# Patient Record
Sex: Male | Born: 1950 | Race: White | Hispanic: No | Smoking: Former smoker
Health system: Southern US, Community
[De-identification: ages and names within clinical notes are randomized; demographics above are authoritative.]

## PROBLEM LIST (undated history)

## (undated) DIAGNOSIS — F17210 Nicotine dependence, cigarettes, uncomplicated: Secondary | ICD-10-CM

## (undated) DIAGNOSIS — K279 Peptic ulcer, site unspecified, unspecified as acute or chronic, without hemorrhage or perforation: Secondary | ICD-10-CM

## (undated) DIAGNOSIS — E559 Vitamin D deficiency, unspecified: Secondary | ICD-10-CM

## (undated) DIAGNOSIS — E538 Deficiency of other specified B group vitamins: Secondary | ICD-10-CM

## (undated) DIAGNOSIS — J189 Pneumonia, unspecified organism: Secondary | ICD-10-CM

## (undated) DIAGNOSIS — E039 Hypothyroidism, unspecified: Secondary | ICD-10-CM

## (undated) DIAGNOSIS — R200 Anesthesia of skin: Secondary | ICD-10-CM

## (undated) DIAGNOSIS — F329 Major depressive disorder, single episode, unspecified: Secondary | ICD-10-CM

## (undated) DIAGNOSIS — Z972 Presence of dental prosthetic device (complete) (partial): Secondary | ICD-10-CM

## (undated) DIAGNOSIS — N210 Calculus in bladder: Secondary | ICD-10-CM

## (undated) DIAGNOSIS — Z973 Presence of spectacles and contact lenses: Secondary | ICD-10-CM

## (undated) DIAGNOSIS — J449 Chronic obstructive pulmonary disease, unspecified: Secondary | ICD-10-CM

## (undated) DIAGNOSIS — M545 Low back pain, unspecified: Secondary | ICD-10-CM

## (undated) DIAGNOSIS — E119 Type 2 diabetes mellitus without complications: Secondary | ICD-10-CM

## (undated) DIAGNOSIS — N401 Enlarged prostate with lower urinary tract symptoms: Secondary | ICD-10-CM

## (undated) DIAGNOSIS — M1990 Unspecified osteoarthritis, unspecified site: Secondary | ICD-10-CM

## (undated) DIAGNOSIS — E782 Mixed hyperlipidemia: Secondary | ICD-10-CM

## (undated) DIAGNOSIS — N400 Enlarged prostate without lower urinary tract symptoms: Secondary | ICD-10-CM

## (undated) DIAGNOSIS — F32A Depression, unspecified: Secondary | ICD-10-CM

## (undated) DIAGNOSIS — F1010 Alcohol abuse, uncomplicated: Secondary | ICD-10-CM

## (undated) DIAGNOSIS — E785 Hyperlipidemia, unspecified: Secondary | ICD-10-CM

## (undated) DIAGNOSIS — N329 Bladder disorder, unspecified: Secondary | ICD-10-CM

## (undated) HISTORY — DX: Benign prostatic hyperplasia without lower urinary tract symptoms: N40.0

## (undated) HISTORY — DX: Anesthesia of skin: R20.0

## (undated) HISTORY — PX: BACK SURGERY: SHX140

## (undated) HISTORY — DX: Depression, unspecified: F32.A

## (undated) HISTORY — DX: Deficiency of other specified B group vitamins: E53.8

## (undated) HISTORY — DX: Type 2 diabetes mellitus without complications: E11.9

## (undated) HISTORY — PX: UPPER GI ENDOSCOPY: SHX6162

## (undated) HISTORY — DX: Low back pain, unspecified: M54.50

## (undated) HISTORY — DX: Peptic ulcer, site unspecified, unspecified as acute or chronic, without hemorrhage or perforation: K27.9

## (undated) HISTORY — PX: TONSILLECTOMY: SUR1361

## (undated) HISTORY — PX: KNEE SURGERY: SHX244

## (undated) HISTORY — DX: Hyperlipidemia, unspecified: E78.5

## (undated) HISTORY — DX: Hypothyroidism, unspecified: E03.9

## (undated) HISTORY — PX: COLONOSCOPY: SHX174

---
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
## 1998-01-01 ENCOUNTER — Emergency Department (HOSPITAL_COMMUNITY): Admission: EM | Admit: 1998-01-01 | Discharge: 1998-01-01 | Payer: Self-pay | Admitting: Emergency Medicine

## 1998-02-13 ENCOUNTER — Encounter: Admission: RE | Admit: 1998-02-13 | Discharge: 1998-05-14 | Payer: Self-pay | Admitting: Internal Medicine

## 2005-04-16 ENCOUNTER — Ambulatory Visit: Payer: Self-pay | Admitting: Endocrinology

## 2005-04-16 ENCOUNTER — Inpatient Hospital Stay (HOSPITAL_COMMUNITY): Admission: EM | Admit: 2005-04-16 | Discharge: 2005-04-17 | Payer: Self-pay | Admitting: Emergency Medicine

## 2005-04-17 ENCOUNTER — Ambulatory Visit: Payer: Self-pay | Admitting: Cardiology

## 2005-04-21 ENCOUNTER — Ambulatory Visit: Payer: Self-pay

## 2005-04-27 ENCOUNTER — Ambulatory Visit: Payer: Self-pay | Admitting: Cardiology

## 2005-04-28 ENCOUNTER — Ambulatory Visit: Payer: Self-pay | Admitting: Cardiology

## 2005-05-14 ENCOUNTER — Ambulatory Visit: Payer: Self-pay | Admitting: Internal Medicine

## 2005-07-14 ENCOUNTER — Ambulatory Visit: Payer: Self-pay | Admitting: Internal Medicine

## 2005-07-22 ENCOUNTER — Ambulatory Visit: Payer: Self-pay | Admitting: Internal Medicine

## 2006-10-06 ENCOUNTER — Ambulatory Visit: Payer: Self-pay | Admitting: Internal Medicine

## 2006-11-17 ENCOUNTER — Ambulatory Visit: Payer: Self-pay | Admitting: Internal Medicine

## 2006-11-17 LAB — CONVERTED CEMR LAB: TSH: 6.27 microintl units/mL — ABNORMAL HIGH (ref 0.35–5.50)

## 2006-11-21 ENCOUNTER — Ambulatory Visit: Payer: Self-pay | Admitting: Internal Medicine

## 2006-12-05 ENCOUNTER — Ambulatory Visit: Payer: Self-pay | Admitting: Internal Medicine

## 2007-05-19 ENCOUNTER — Ambulatory Visit: Payer: Self-pay | Admitting: Internal Medicine

## 2007-05-19 DIAGNOSIS — M79609 Pain in unspecified limb: Secondary | ICD-10-CM

## 2007-05-19 DIAGNOSIS — E785 Hyperlipidemia, unspecified: Secondary | ICD-10-CM

## 2007-05-19 DIAGNOSIS — E039 Hypothyroidism, unspecified: Secondary | ICD-10-CM | POA: Insufficient documentation

## 2007-05-23 LAB — CONVERTED CEMR LAB
ALT: 27 units/L (ref 0–53)
AST: 23 units/L (ref 0–37)
Albumin: 4.2 g/dL (ref 3.5–5.2)
Alkaline Phosphatase: 56 units/L (ref 39–117)
BUN: 11 mg/dL (ref 6–23)
Basophils Absolute: 0 10*3/uL (ref 0.0–0.1)
Basophils Relative: 0.3 % (ref 0.0–1.0)
Bilirubin, Direct: 0.1 mg/dL (ref 0.0–0.3)
CO2: 33 meq/L — ABNORMAL HIGH (ref 19–32)
Calcium: 9.9 mg/dL (ref 8.4–10.5)
Chloride: 106 meq/L (ref 96–112)
Cholesterol: 155 mg/dL (ref 0–200)
Creatinine, Ser: 1.2 mg/dL (ref 0.4–1.5)
Direct LDL: 95.5 mg/dL
Eosinophils Absolute: 0.2 10*3/uL (ref 0.0–0.6)
Eosinophils Relative: 1.8 % (ref 0.0–5.0)
GFR calc Af Amer: 80 mL/min
GFR calc non Af Amer: 66 mL/min
Glucose, Bld: 107 mg/dL — ABNORMAL HIGH (ref 70–99)
HCT: 46.3 % (ref 39.0–52.0)
HDL: 22.9 mg/dL — ABNORMAL LOW (ref >39.0)
Hemoglobin: 16.1 g/dL (ref 13.0–17.0)
Lymphocytes Relative: 37.5 % (ref 12.0–46.0)
MCHC: 34.7 g/dL (ref 30.0–36.0)
MCV: 101.5 fL — ABNORMAL HIGH (ref 78.0–100.0)
Monocytes Absolute: 0.6 10*3/uL (ref 0.2–0.7)
Monocytes Relative: 5.1 % (ref 3.0–11.0)
Neutro Abs: 5.9 10*3/uL (ref 1.4–7.7)
Neutrophils Relative %: 55.3 % (ref 43.0–77.0)
Platelets: 283 10*3/uL (ref 150–400)
Potassium: 4.3 meq/L (ref 3.5–5.1)
RBC: 4.57 M/uL (ref 4.22–5.81)
RDW: 12.5 % (ref 11.5–14.6)
Sodium: 145 meq/L (ref 135–145)
TSH: 3.49 microintl units/mL (ref 0.35–5.50)
Total Bilirubin: 0.9 mg/dL (ref 0.3–1.2)
Total CHOL/HDL Ratio: 6.8
Total Protein: 7.1 g/dL (ref 6.0–8.3)
Triglycerides: 259 mg/dL (ref 0–149)
VLDL: 52 mg/dL — ABNORMAL HIGH (ref 0–40)
WBC: 10.8 10*3/uL — ABNORMAL HIGH (ref 4.5–10.5)

## 2007-07-17 ENCOUNTER — Ambulatory Visit: Payer: Self-pay | Admitting: Internal Medicine

## 2007-07-20 ENCOUNTER — Encounter: Admission: RE | Admit: 2007-07-20 | Discharge: 2007-07-20 | Payer: Self-pay | Admitting: Internal Medicine

## 2007-07-31 LAB — CONVERTED CEMR LAB
BUN: 13 mg/dL (ref 6–23)
CO2: 26 meq/L (ref 19–32)
Calcium: 9.7 mg/dL (ref 8.4–10.5)
Chloride: 102 meq/L (ref 96–112)
Creatinine, Ser: 1.1 mg/dL (ref 0.4–1.5)
GFR calc Af Amer: 88 mL/min
GFR calc non Af Amer: 73 mL/min
Glucose, Bld: 170 mg/dL — ABNORMAL HIGH (ref 70–99)
Potassium: 4 meq/L (ref 3.5–5.1)
Sodium: 139 meq/L (ref 135–145)
TSH: 6.66 microintl units/mL — ABNORMAL HIGH (ref 0.35–5.50)

## 2007-09-21 HISTORY — PX: BACK SURGERY: SHX140

## 2007-10-19 ENCOUNTER — Ambulatory Visit (HOSPITAL_COMMUNITY): Admission: RE | Admit: 2007-10-19 | Discharge: 2007-10-20 | Payer: Self-pay | Admitting: Neurosurgery

## 2007-10-19 HISTORY — PX: LUMBAR LAMINECTOMY: SHX95

## 2007-11-09 ENCOUNTER — Encounter: Payer: Self-pay | Admitting: Internal Medicine

## 2007-11-17 ENCOUNTER — Ambulatory Visit: Payer: Self-pay | Admitting: Internal Medicine

## 2007-11-17 DIAGNOSIS — G2581 Restless legs syndrome: Secondary | ICD-10-CM

## 2007-11-21 LAB — CONVERTED CEMR LAB: Ferritin: 210.2 ng/mL (ref 22.0–322.0)

## 2007-11-29 ENCOUNTER — Encounter: Payer: Self-pay | Admitting: Internal Medicine

## 2008-09-24 ENCOUNTER — Ambulatory Visit: Payer: Self-pay | Admitting: Internal Medicine

## 2008-09-24 DIAGNOSIS — M722 Plantar fascial fibromatosis: Secondary | ICD-10-CM | POA: Insufficient documentation

## 2009-03-31 ENCOUNTER — Encounter (INDEPENDENT_AMBULATORY_CARE_PROVIDER_SITE_OTHER): Payer: Self-pay | Admitting: *Deleted

## 2009-11-20 ENCOUNTER — Telehealth: Payer: Self-pay | Admitting: Gastroenterology

## 2010-10-20 NOTE — Progress Notes (Signed)
Summary: Schedule Colonoscopy  Phone Note Outgoing Call Call back at Home Phone [phone]   Call placed by: Harlow Mares CMA Duncan Dull),  November 20, 2009 10:27 AM Call placed to: Patient Summary of Call: patinets number is disconnected Initial call taken by: Harlow Mares CMA (AAMA),  November 20, 2009 10:28 AM

## 2011-02-02 NOTE — Op Note (Signed)
NAME:  MASARU, CHAMBERLIN NO.:  [id number]   MEDICAL RECORD NO.:  [id number]          PATIENT TYPE:  OIB   LOCATION:  3599                         FACILITY:  [facility]   PHYSICIAN:  Reinaldo Meeker, M.D. DATE OF BIRTH:  06-21-1951   DATE OF PROCEDURE:  10/19/2007  DATE OF DISCHARGE:                               OPERATIVE REPORT   PREOPERATIVE DIAGNOSIS:  Spinal stenosis L3-L4 and L4-L5.   POSTOPERATIVE DIAGNOSIS:  Spinal stenosis L3-L4 and L4-L5.   PROCEDURE:  L3-L4 and L4-L5 bilateral complete decompressive  laminectomy.   SURGEON:  Reinaldo Meeker, M.D.   PROCEDURE IN DETAIL:  After being placed in the prone position, the  patient's back was prepped and draped in the usual sterile fashion.  A  localizing x-ray was taken prior to incision to identify the appropriate  level.  A midline incision was made above the spinous processes of L3,  L4, and L5.  Using Bovie cutting current, the incision was carried up  the spinous processes.  Subperiosteal dissection was then carried out  bilaterally on the spinous processes and lamina and facet joint and self-  retaining retractor was placed for exposure.  X-rays showed we  approached the appropriate level.  Spinous processes of L3, L4, and L5  were removed.  Starting on the patient's left side, a laminotomy was  performed by removing the inferior half of the L3 lamina, the third of  the facet joint, the entire L4 lamina, the medial third of the L4-L5  facet joint, and the superior half of the L5 lamina.  Residual bone and  hypertrophic ligamentum flavum were removed in a piecemeal fashion.  A  similar decompression was then carried out on the opposite side.  Once  this was completed, residual midline structures were removed to complete  the bilateral decompressive laminectomy.  At this time, L4 and L5 nerve  roots were tracked up their foramen and found to be well decompressed.  There is no evidence of further lateral  recess stenosis, as well.  The  discs were examined and found not to be in need of removal.  At this  time, large amounts of irrigation were carried out.  Any bleeding was  controlled with bipolar coagulation and Gelfoam.  The wound was then  closed in multiple layers of Vicryl in the muscle, fascia, subcutaneous,  and subcuticular tissues, and staples were placed on the skin.  A  sterile dressing was then applied.  The patient was extubated and taken  to the recovery room in stable condition.           ______________________________  Reinaldo Meeker, M.D.     ROK/MEDQ  D:  10/19/2007  T:  10/19/2007  Job:  [id number]
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
## 2011-02-05 NOTE — Consult Note (Signed)
NAME:  MANU, RUBEY NO.:  [id number]   MEDICAL RECORD NO.:  [id number]          PATIENT TYPE:  INP   LOCATION:  2903                         FACILITY:  [facility]   PHYSICIAN:  Olga Millers, M.D. [facility] OF BIRTH:  Feb 23, 1951   DATE OF CONSULTATION:  04/17/2005  DATE OF DISCHARGE:                                   CONSULTATION   REASON FOR CONSULTATION:  Mr. Flury is a 60 year old male with past medical  history of tobacco use who presents for evaluation of chest pain.  The  patient has no prior cardiac history.  He states he has occasionally had  heartburn in the past.  However, he does not have exertional chest pain.  He  does have chronic dyspnea on exertion, but there is no orthopnea or PND.  He  states that he occasionally has mild pedal edema.  Yesterday evening while  walking to the kitchen the patient developed left upper chest discomfort.  It was described as a sharp pain.  It radiated to the left neck, but not  to the left upper extremity.  There was associated shortness of breath and  diaphoresis, but there was no nausea or vomiting.  It did increase with  certain movements by his report.  It lasted for five to 10 minutes and  resolved spontaneously.  He came to the emergency room and was admitted.  Cardiology is now asked to further evaluate.   ALLERGIES:  No known drug allergies.   MEDICATIONS:  He is on no medications at home.   SOCIAL HISTORY:  He does smoke.  He also consumes five to 10 alcoholic  beverages per week by his report.  He denies any cocaine use.   FAMILY HISTORY:  Positive for coronary artery disease in his mother who had  a myocardial infarction at age 34, by his report, and also has had coronary  artery bypass grafting.   PAST MEDICAL HISTORY:  He denies any diabetes mellitus, hypertension, or  hyperlipidemia.  There is no prior cardiac history.  He has had prior  arthroscopic surgery on his knee, and tonsillectomy, but there is  no other  significant past medical history noted.   REVIEW OF SYSTEMS:  He denies any headaches, or fevers or chills.  There was  no productive cough or hemoptysis.  There is no dysphagia, odynophagia,  melena, or hematochezia.  There is no dysuria or hematuria.  There is no  seizure activity.  He does occasionally describe mild orthopnea and pedal  edema, but there is no PND.  He has chronic pain in his legs, but no  claudication.  The remaining systems are negative.   PHYSICAL EXAMINATION:  VITAL SIGNS:  Blood pressure 100/50, pulse 96.  He is  afebrile.  GENERAL:  He is well-developed, well-nourished, in no acute distress.  SKIN:  Warm and dry.  No peripheral clubbing.  PSYCHIATRIC:  He does not appear to be depressed.  HEENT:  Unremarkable with normal eyelids.  NECK:  Supple with normal upstrokes bilaterally.  I cannot appreciate any  bruits.  There is no  jugular venous distention.  I cannot appreciate  thyromegaly.  CHEST:  Clear to auscultation, normal expansion.  CARDIOVASCULAR:  Regular rate and rhythm with a normal S1 and S2.  There are  no murmurs, rubs, or gallops noted.  ABDOMEN:  Nontender, nondistended, positive bowel sounds, no  hepatosplenomegaly, no masses appreciated.  There is no abdominal bruit.  He  has 2+ femoral pulses bilaterally and there are no bruits noted.  EXTREMITIES:  No edema and I can palpate no cords.  He has 2+ dorsalis pedis  pulses bilaterally.  NEUROLOGIC:  Grossly intact.   LABORATORY DATA:  His electrocardiogram shows a possible ectopic atrial  rhythm at a rate of 74.  The axis is normal.  There are nonspecific ST  changes.  His chest x-ray showed low lung volumes and mild cardiomegaly by  report.  His hemoglobin is 15.2, hematocrit 45.  His platelet count is 302,  and his white blood cell count is normal.  His D-dimer is negative.  His BUN  and creatinine are 9 and 1.1.  His liver function's are normal.  His enzymes  are negative so far.   His alcohol level was 148.   DIAGNOSES:  1.  Atypical chest pain.  2.  Tobacco abuse.  3.  Possible ETOH abuse.   PLAN:  Mr. Coburn presents with atypical chest pain of uncertain etiology.  Given the description that it does increase with certain movements, it may  be musculoskeletal.  We will continue and complete rule out with serial  enzymes.  If his enzymes are negative and his followup electrocardiogram is  unchanged, then we will plan an outpatient Myoview for risk stratification.  If his enzymes are positive, then he would require cardiac catheterization.  I would continue with his present dose of aspirin.  I have discussed the  importance of discontinuing his tobacco use.  He also needs to reduce his  alcohol intake.       BC/MEDQ  D:  04/17/2005  T:  04/17/2005  Job:  [id number]

## 2011-06-10 LAB — COMPREHENSIVE METABOLIC PANEL
Alkaline Phosphatase: 53
BUN: 13
Chloride: 104
Creatinine, Ser: 1.21
Potassium: 4.9
Sodium: 141

## 2011-06-10 LAB — CBC
HCT: 47
Hemoglobin: 16.4
MCHC: 34.9
MCV: 98.2
Platelets: 270
RBC: 4.79
RDW: 12.8
WBC: 11.1 — ABNORMAL HIGH

## 2013-09-20 DIAGNOSIS — Z8619 Personal history of other infectious and parasitic diseases: Secondary | ICD-10-CM

## 2013-09-20 HISTORY — DX: Personal history of other infectious and parasitic diseases: Z86.19

## 2013-09-20 HISTORY — PX: UPPER GI ENDOSCOPY: SHX6162

## 2014-07-08 DIAGNOSIS — D7589 Other specified diseases of blood and blood-forming organs: Secondary | ICD-10-CM | POA: Diagnosis not present

## 2014-07-08 DIAGNOSIS — E039 Hypothyroidism, unspecified: Secondary | ICD-10-CM | POA: Diagnosis not present

## 2014-07-08 DIAGNOSIS — G5620 Lesion of ulnar nerve, unspecified upper limb: Secondary | ICD-10-CM | POA: Diagnosis not present

## 2014-07-08 DIAGNOSIS — Z125 Encounter for screening for malignant neoplasm of prostate: Secondary | ICD-10-CM | POA: Diagnosis not present

## 2014-07-08 DIAGNOSIS — Z79899 Other long term (current) drug therapy: Secondary | ICD-10-CM | POA: Diagnosis not present

## 2014-07-08 DIAGNOSIS — Z0000 Encounter for general adult medical examination without abnormal findings: Secondary | ICD-10-CM | POA: Diagnosis not present

## 2014-07-08 DIAGNOSIS — Z23 Encounter for immunization: Secondary | ICD-10-CM | POA: Diagnosis not present

## 2014-07-08 DIAGNOSIS — E1122 Type 2 diabetes mellitus with diabetic chronic kidney disease: Secondary | ICD-10-CM | POA: Diagnosis not present

## 2014-07-08 DIAGNOSIS — N183 Chronic kidney disease, stage 3 (moderate): Secondary | ICD-10-CM | POA: Diagnosis not present

## 2014-07-08 DIAGNOSIS — I10 Essential (primary) hypertension: Secondary | ICD-10-CM | POA: Diagnosis not present

## 2014-07-09 ENCOUNTER — Other Ambulatory Visit: Payer: Self-pay | Admitting: Family Medicine

## 2014-07-09 DIAGNOSIS — I714 Abdominal aortic aneurysm, without rupture, unspecified: Secondary | ICD-10-CM

## 2014-07-15 ENCOUNTER — Ambulatory Visit
Admission: RE | Admit: 2014-07-15 | Discharge: 2014-07-15 | Disposition: A | Discharge status: Home / Self Care | Payer: Medicare Other | Source: Ambulatory Visit | Attending: Family Medicine | Admitting: Family Medicine

## 2014-07-15 DIAGNOSIS — I714 Abdominal aortic aneurysm, without rupture, unspecified: Secondary | ICD-10-CM

## 2014-07-15 DIAGNOSIS — I779 Disorder of arteries and arterioles, unspecified: Secondary | ICD-10-CM | POA: Diagnosis not present

## 2014-09-06 DIAGNOSIS — N183 Chronic kidney disease, stage 3 (moderate): Secondary | ICD-10-CM | POA: Diagnosis not present

## 2014-09-06 DIAGNOSIS — E1122 Type 2 diabetes mellitus with diabetic chronic kidney disease: Secondary | ICD-10-CM | POA: Diagnosis not present

## 2014-10-21 DEATH — deceased

## 2014-11-18 ENCOUNTER — Emergency Department (HOSPITAL_COMMUNITY): Payer: Medicare Other

## 2014-11-18 ENCOUNTER — Encounter (HOSPITAL_COMMUNITY): Payer: Self-pay | Admitting: Emergency Medicine

## 2014-11-18 ENCOUNTER — Emergency Department (HOSPITAL_COMMUNITY)
Admission: EM | Admit: 2014-11-18 | Discharge: 2014-11-18 | Disposition: A | Discharge status: Home / Self Care | Payer: Medicare Other | Attending: Emergency Medicine | Admitting: Emergency Medicine

## 2014-11-18 DIAGNOSIS — R079 Chest pain, unspecified: Secondary | ICD-10-CM | POA: Diagnosis present

## 2014-11-18 DIAGNOSIS — K760 Fatty (change of) liver, not elsewhere classified: Secondary | ICD-10-CM | POA: Diagnosis not present

## 2014-11-18 DIAGNOSIS — R200 Anesthesia of skin: Secondary | ICD-10-CM | POA: Diagnosis not present

## 2014-11-18 DIAGNOSIS — Z79899 Other long term (current) drug therapy: Secondary | ICD-10-CM | POA: Insufficient documentation

## 2014-11-18 DIAGNOSIS — K8050 Calculus of bile duct without cholangitis or cholecystitis without obstruction: Secondary | ICD-10-CM | POA: Insufficient documentation

## 2014-11-18 DIAGNOSIS — I252 Old myocardial infarction: Secondary | ICD-10-CM | POA: Insufficient documentation

## 2014-11-18 DIAGNOSIS — Z720 Tobacco use: Secondary | ICD-10-CM | POA: Insufficient documentation

## 2014-11-18 DIAGNOSIS — Z7982 Long term (current) use of aspirin: Secondary | ICD-10-CM | POA: Insufficient documentation

## 2014-11-18 DIAGNOSIS — E119 Type 2 diabetes mellitus without complications: Secondary | ICD-10-CM | POA: Insufficient documentation

## 2014-11-18 DIAGNOSIS — R109 Unspecified abdominal pain: Secondary | ICD-10-CM

## 2014-11-18 DIAGNOSIS — K8020 Calculus of gallbladder without cholecystitis without obstruction: Secondary | ICD-10-CM | POA: Diagnosis not present

## 2014-11-18 DIAGNOSIS — K839 Disease of biliary tract, unspecified: Secondary | ICD-10-CM | POA: Diagnosis not present

## 2014-11-18 DIAGNOSIS — R1013 Epigastric pain: Secondary | ICD-10-CM | POA: Diagnosis not present

## 2014-11-18 DIAGNOSIS — R1011 Right upper quadrant pain: Secondary | ICD-10-CM | POA: Diagnosis not present

## 2014-11-18 LAB — I-STAT CHEM 8, ED
BUN: 12 mg/dL (ref 6–23)
CALCIUM ION: 1.16 mmol/L (ref 1.13–1.30)
Chloride: 103 mmol/L (ref 96–112)
Creatinine, Ser: 1.2 mg/dL (ref 0.50–1.35)
Glucose, Bld: 155 mg/dL — ABNORMAL HIGH (ref 70–99)
HCT: 54 % — ABNORMAL HIGH (ref 39.0–52.0)
HEMOGLOBIN: 18.4 g/dL — AB (ref 13.0–17.0)
Potassium: 3.8 mmol/L (ref 3.5–5.1)
SODIUM: 140 mmol/L (ref 135–145)
TCO2: 23 mmol/L (ref 0–100)

## 2014-11-18 LAB — DIFFERENTIAL
BASOS ABS: 0.1 10*3/uL (ref 0.0–0.1)
BASOS PCT: 1 % (ref 0–1)
EOS ABS: 0.3 10*3/uL (ref 0.0–0.7)
EOS PCT: 3 % (ref 0–5)
LYMPHS ABS: 4.4 10*3/uL — AB (ref 0.7–4.0)
LYMPHS PCT: 45 % (ref 12–46)
MONO ABS: 0.5 10*3/uL (ref 0.1–1.0)
MONOS PCT: 5 % (ref 3–12)
NEUTROS ABS: 4.5 10*3/uL (ref 1.7–7.7)
Neutrophils Relative %: 46 % (ref 43–77)

## 2014-11-18 LAB — CBC
HEMATOCRIT: 48.5 % (ref 39.0–52.0)
HEMOGLOBIN: 17 g/dL (ref 13.0–17.0)
MCH: 34.9 pg — AB (ref 26.0–34.0)
MCHC: 35.1 g/dL (ref 30.0–36.0)
MCV: 99.6 fL (ref 78.0–100.0)
Platelets: 255 10*3/uL (ref 150–400)
RBC: 4.87 MIL/uL (ref 4.22–5.81)
RDW: 13.2 % (ref 11.5–15.5)
WBC: 9.8 10*3/uL (ref 4.0–10.5)

## 2014-11-18 LAB — LIPASE, BLOOD: Lipase: 43 U/L (ref 11–59)

## 2014-11-18 LAB — HEPATIC FUNCTION PANEL
ALBUMIN: 4.1 g/dL (ref 3.5–5.2)
ALT: 29 U/L (ref 0–53)
AST: 23 U/L (ref 0–37)
Alkaline Phosphatase: 50 U/L (ref 39–117)
BILIRUBIN INDIRECT: 0.5 mg/dL (ref 0.3–0.9)
Bilirubin, Direct: 0.1 mg/dL (ref 0.0–0.5)
TOTAL PROTEIN: 6.8 g/dL (ref 6.0–8.3)
Total Bilirubin: 0.6 mg/dL (ref 0.3–1.2)

## 2014-11-18 LAB — I-STAT TROPONIN, ED: TROPONIN I, POC: 0 ng/mL (ref 0.00–0.08)

## 2014-11-18 MED ORDER — ASPIRIN 81 MG PO CHEW
324.0000 mg | CHEWABLE_TABLET | Freq: Once | ORAL | Status: AC
  Administered 2014-11-18: 324 mg via ORAL
  Filled 2014-11-18: qty 4

## 2014-11-18 MED ORDER — ONDANSETRON HCL 4 MG/2ML IJ SOLN
4.0000 mg | Freq: Once | INTRAMUSCULAR | Status: AC
  Administered 2014-11-18: 4 mg via INTRAVENOUS
  Filled 2014-11-18: qty 2

## 2014-11-18 MED ORDER — NITROGLYCERIN 0.4 MG SL SUBL
0.4000 mg | SUBLINGUAL_TABLET | SUBLINGUAL | Status: DC | PRN
  Filled 2014-11-18: qty 1

## 2014-11-18 NOTE — ED Notes (Signed)
Pt st's he had onset of nausea and had dry heaves then developed left upper chest pain.  Pt c/o left arm feeling numb up into left shoulder

## 2014-11-18 NOTE — ED Notes (Signed)
Pt states nitro gives him terrible HA, declined at this time, pain 4/10

## 2014-11-18 NOTE — ED Provider Notes (Signed)
CSN: [id number]     Arrival date & time 11/18/14  0109 History  This chart was scribed for Leonard Fuel, MD by Randa Evens, ED Scribe. This patient was seen in room A09C/A09C and the patient's care was started at 1:52 AM.      Chief Complaint  Patient presents with  . Chest Pain   Patient is a 64 y.o. male presenting with chest pain. The history is provided by the patient. No language interpreter was used.  Chest Pain Associated symptoms: nausea, numbness and vomiting   Associated symptoms: no fever    HPI Comments: Leonard Walton is a 64 y.o. male with PMHx of diabetes who presents to the Emergency Department complaining of aching chest pain rated 7/10 onset PTA. Pt states that his CP began after feeling nauseous and vomiting. Pt states that he feels as if the left arm is numb. Pt denies any medications PTA. Pt states that the pain is worse with deep breathing. Pt states that sitting up makes the pain better. Pt doesn't report any other symptoms.   Past Medical History  Diagnosis Date  . MI (myocardial infarction)    Past Surgical History  Procedure Laterality Date  . Back surgery     No family history on file. History  Substance Use Topics  . Smoking status: Current Every Day Smoker  . Smokeless tobacco: Not on file  . Alcohol Use: Yes    Review of Systems  Constitutional: Negative for fever.  Cardiovascular: Positive for chest pain.  Gastrointestinal: Positive for nausea and vomiting.  Neurological: Positive for numbness.  All other systems reviewed and are negative.     Allergies  Review of patient's allergies indicates no known allergies.  Home Medications   Prior to Admission medications   Medication Sig Start Date End Date Taking? Authorizing Provider  aspirin EC 81 MG tablet Take 81 mg by mouth daily.   Yes Historical Provider, MD  levothyroxine (SYNTHROID, LEVOTHROID) 75 MCG tablet Take 75 mcg by mouth daily. 11/11/14  Yes Historical Provider, MD  metFORMIN  (GLUCOPHAGE) 500 MG tablet Take 500 mg by mouth 2 (two) times daily with a meal.  10/31/14  Yes Historical Provider, MD   BP 120/83 mmHg  Pulse 61  Temp(Src) 98.4 F (36.9 C) (Oral)  Resp 16  Ht 5\' 7"  (1.702 m)  Wt 210 lb (95.255 kg)  BMI 32.88 kg/m2  SpO2 100%   Physical Exam  Constitutional: He is oriented to person, place, and time. He appears well-developed and well-nourished. No distress.  HENT:  Head: Normocephalic and atraumatic.  Eyes: Conjunctivae and EOM are normal. Pupils are equal, round, and reactive to light.  Neck: Normal range of motion. Neck supple. No JVD present.  Cardiovascular: Normal rate, regular rhythm and normal heart sounds.   No murmur heard. Pulmonary/Chest: Effort normal and breath sounds normal. He has no wheezes. He has no rales. He exhibits no tenderness.  Abdominal: Soft. He exhibits no distension and no mass. Bowel sounds are decreased. There is tenderness (moderate) in the right upper quadrant and epigastric area. There is positive Murphy's sign.  Musculoskeletal: Normal range of motion. He exhibits no edema.  Lymphadenopathy:    He has no cervical adenopathy.  Neurological: He is alert and oriented to person, place, and time. No cranial nerve deficit. He exhibits normal muscle tone. Coordination normal.  Skin: Skin is warm and dry. No rash noted.  Psychiatric: He has a normal mood and affect. His behavior is normal.  Judgment and thought content normal.  Nursing note and vitals reviewed.   ED Course  Procedures (including critical care time) DIAGNOSTIC STUDIES: Oxygen Saturation is 100% on RA, normal by my interpretation.    COORDINATION OF CARE: 2:01 AM-Discussed treatment plan with pt at bedside and pt agreed to plan.     Labs Review Labs Reviewed  CBC - Abnormal; Notable for the following:    MCH 34.9 (*)    All other components within normal limits  DIFFERENTIAL - Abnormal; Notable for the following:    Lymphs Abs 4.4 (*)    All  other components within normal limits  I-STAT CHEM 8, ED - Abnormal; Notable for the following:    Glucose, Bld 155 (*)    Hemoglobin 18.4 (*)    HCT 54.0 (*)    All other components within normal limits  HEPATIC FUNCTION PANEL  LIPASE, BLOOD  I-STAT TROPOININ, ED    Imaging Review Dg Chest 2 View  11/18/2014   CLINICAL DATA:  Chest pain  EXAM: CHEST  2 VIEW  COMPARISON:  04/16/2005  FINDINGS: Normal heart size and mediastinal contours. No acute infiltrate or edema. No effusion or pneumothorax. No acute osseous findings.  IMPRESSION: No active cardiopulmonary disease.   Electronically Signed   By: Monte Fantasia M.D.   On: 11/18/2014 02:15   US Abdomen Complete  11/18/2014   CLINICAL DATA:  Diffuse abdominal pain with nausea and vomiting  EXAM: ULTRASOUND ABDOMEN COMPLETE  COMPARISON:  07/15/2014  FINDINGS: Gallbladder: Dependent mid level echoes consistent with sludge; no visible stone. No wall thickening or focal tenderness per sonographer exam  Common bile duct: Diameter: 5 mm.  Liver: Echogenic liver with poor acoustic transmission consistent with steatosis. This findings decreases the sensitivity of sonography in evaluating the liver. No focal lesion is seen. Noted antegrade flow in the imaged portal venous system  IVC: Not visualized  Pancreas: Not visualized  Spleen: Size and appearance within normal limits.  Right Kidney: Length: 11.7 Cm. Echogenicity within normal limits. No mass or hydronephrosis visualized.  Left Kidney: Length: 10 cm (likely underestimated). Echogenicity within normal limits. No mass or hydronephrosis visualized.  Abdominal aorta: No aneurysm visualized.  Other findings: None.  IMPRESSION: 1. Hepatic steatosis. 2. Gallbladder sludge. 3. Nonvisualized pancreas due to bowel gas.   Electronically Signed   By: Monte Fantasia M.D.   On: 11/18/2014 05:25     EKG Interpretation   Date/Time:  Monday November 18 2014 01:12:05 EST Ventricular Rate:  66 PR Interval:   140 QRS Duration: 86 QT Interval:  416 QTC Calculation: 436 R Axis:   111 Text Interpretation:  Normal sinus rhythm Right axis deviation Nonspecific  T wave abnormality Abnormal ECG When compared with ECG of 10/13/2007,  Nonspecific T wave abnormality is now Present Confirmed by [facility]  MD,  Jakerra Floyd ([id number]) on 11/18/2014 1:23:16 AM      MDM   Final diagnoses:  Abdominal pain, unspecified abdominal location  Biliary colic      Report of chest pain but findings of seemed to indicate problem is primarily in the abdomen. I am suspicious for possible biliary colic. He was given aspirin and was prescribed nitroglycerin but he refuses nitroglycerin. Pain has completely gone away but, on reexam, he continues to have mild tenderness in the epigastrium and right upper quadrant. He is sent for ultrasound to evaluate for possible cholelithiasis.  Ultrasound shows biliary sludge. Overall picture this visit is that of biliary colic. He is referred  to [facility] surgery for evaluation for possible elective cholecystectomy.   I personally performed the services described in this documentation, which was scribed in my presence. The recorded information has been reviewed and is accurate.       Leonard Fuel, MD 50/53/97 [id number]

## 2014-11-18 NOTE — Discharge Instructions (Signed)
Stay on a low fat diet.  Biliary Colic  Biliary colic is a steady or irregular pain in the upper abdomen. It is usually under the right side of the rib cage. It happens when gallstones interfere with the normal flow of bile from the gallbladder. Bile is a liquid that helps to digest fats. Bile is made in the liver and stored in the gallbladder. When you eat a meal, bile passes from the gallbladder through the cystic duct and the common bile duct into the small intestine. There, it mixes with partially digested food. If a gallstone blocks either of these ducts, the normal flow of bile is blocked. The muscle cells in the bile duct contract forcefully to try to move the stone. This causes the pain of biliary colic.  SYMPTOMS   A person with biliary colic usually complains of pain in the upper abdomen. This pain can be:  In the center of the upper abdomen just below the breastbone.  In the upper-right part of the abdomen, near the gallbladder and liver.  Spread back toward the right shoulder blade.  Nausea and vomiting.  The pain usually occurs after eating.  Biliary colic is usually triggered by the digestive system's demand for bile. The demand for bile is high after fatty meals. Symptoms can also occur when a person who has been fasting suddenly eats a very large meal. Most episodes of biliary colic pass after 1 to 5 hours. After the most intense pain passes, your abdomen may continue to ache mildly for about 24 hours. DIAGNOSIS  After you describe your symptoms, your caregiver will perform a physical exam. He or she will pay attention to the upper right portion of your belly (abdomen). This is the area of your liver and gallbladder. An ultrasound will help your caregiver look for gallstones. Specialized scans of the gallbladder may also be done. Blood tests may be done, especially if you have fever or if your pain persists. PREVENTION  Biliary colic can be prevented by controlling the risk  factors for gallstones. Some of these risk factors, such as heredity, increasing age, and pregnancy are a normal part of life. Obesity and a high-fat diet are risk factors you can change through a healthy lifestyle. Women going through menopause who take hormone replacement therapy (estrogen) are also more likely to develop biliary colic. TREATMENT   Pain medication may be prescribed.  You may be encouraged to eat a fat-free diet.  If the first episode of biliary colic is severe, or episodes of colic keep retuning, surgery to remove the gallbladder (cholecystectomy) is usually recommended. This procedure can be done through small incisions using an instrument called a laparoscope. The procedure often requires a brief stay in the hospital. Some people can leave the hospital the same day. It is the most widely used treatment in people troubled by painful gallstones. It is effective and safe, with no complications in more than 90% of cases.  If surgery cannot be done, medication that dissolves gallstones may be used. This medication is expensive and can take months or years to work. Only small stones will dissolve.  Rarely, medication to dissolve gallstones is combined with a procedure called shock-wave lithotripsy. This procedure uses carefully aimed shock waves to break up gallstones. In many people treated with this procedure, gallstones form again within a few years. PROGNOSIS  If gallstones block your cystic duct or common bile duct, you are at risk for repeated episodes of biliary colic. There is also  a 25% chance that you will develop a gallbladder infection(acute cholecystitis), or some other complication of gallstones within 10 to 20 years. If you have surgery, schedule it at a time that is convenient for you and at a time when you are not sick. HOME CARE INSTRUCTIONS   Drink plenty of clear fluids.  Avoid fatty, greasy or fried foods, or any foods that make your pain worse.  Take  medications as directed. SEEK MEDICAL CARE IF:   You develop a fever over 100.5 F (38.1 C).  Your pain gets worse over time.  You develop nausea that prevents you from eating and drinking.  You develop vomiting. SEEK IMMEDIATE MEDICAL CARE IF:   You have continuous or severe belly (abdominal) pain which is not relieved with medications.  You develop nausea and vomiting which is not relieved with medications.  You have symptoms of biliary colic and you suddenly develop a fever and shaking chills. This may signal cholecystitis. Call your caregiver immediately.  You develop a yellow color to your skin or the white part of your eyes (jaundice). Document Released: 02/07/2006 Document Revised: 11/29/2011 Document Reviewed: 04/18/2008 [facility] Patient Information 2015 [place], [place]. This information is not intended to replace advice given to you by your health care provider. Make sure you discuss any questions you have with your health care provider.

## 2015-01-05 IMAGING — US US AORTA
1 series · 14 of 25 positions shown · non-contrast
Comparison: None.

CLINICAL DATA: History of tobacco abuse

EXAM:
ULTRASOUND OF ABDOMINAL AORTA
TECHNIQUE: Ultrasound examination of the abdominal aorta was performed to
evaluate for abdominal aortic aneurysm.

[Series 1: us aorta · 0.32mm/px · 14 of 28 slices shown]
[im 1/28]
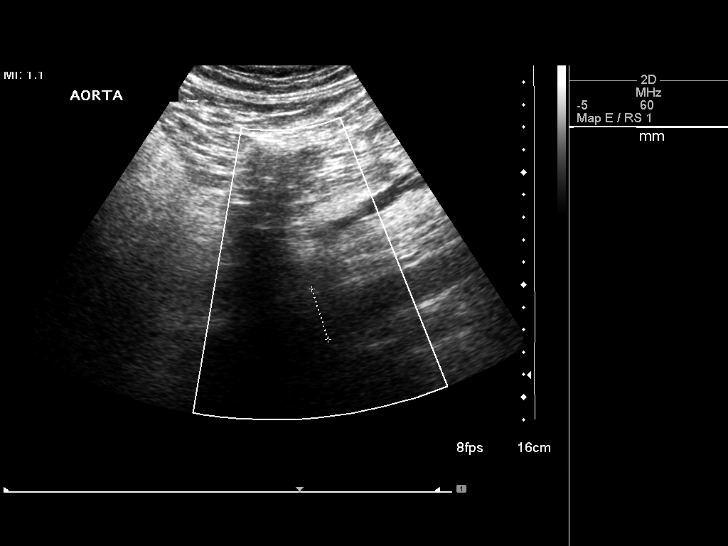
[im 3/28]
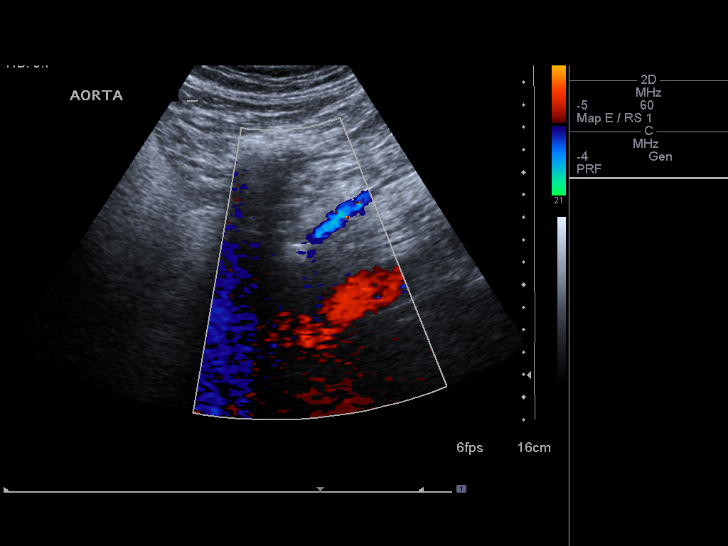
[im 5/28]
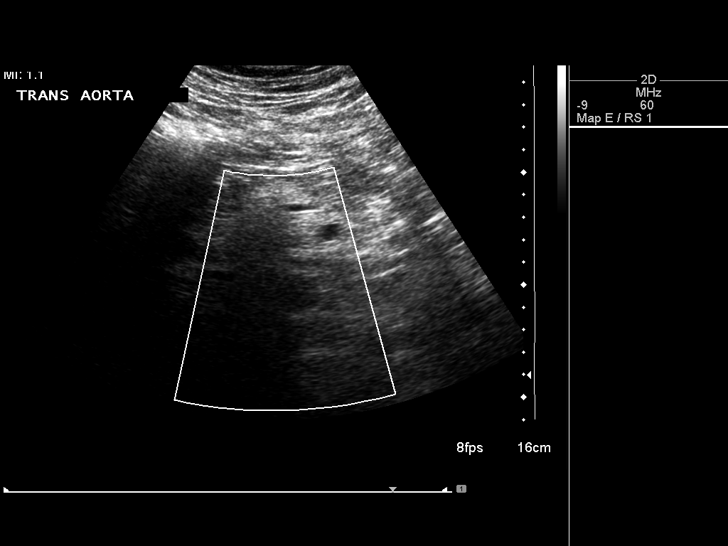
[im 7/28]
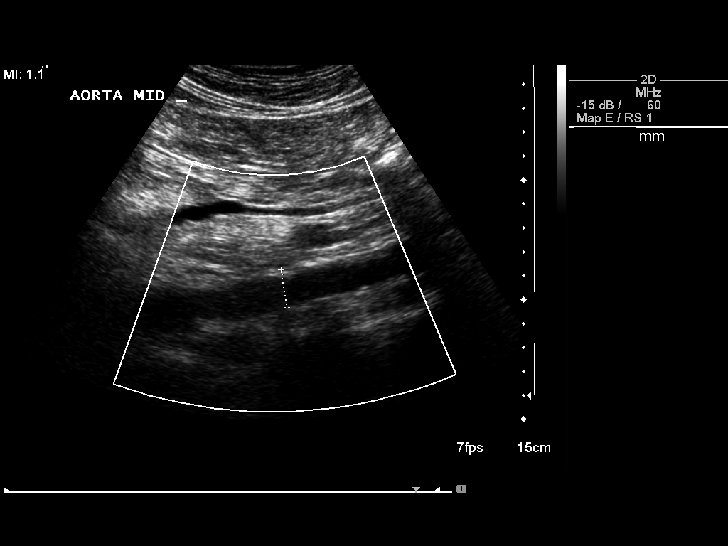
[im 10/28]
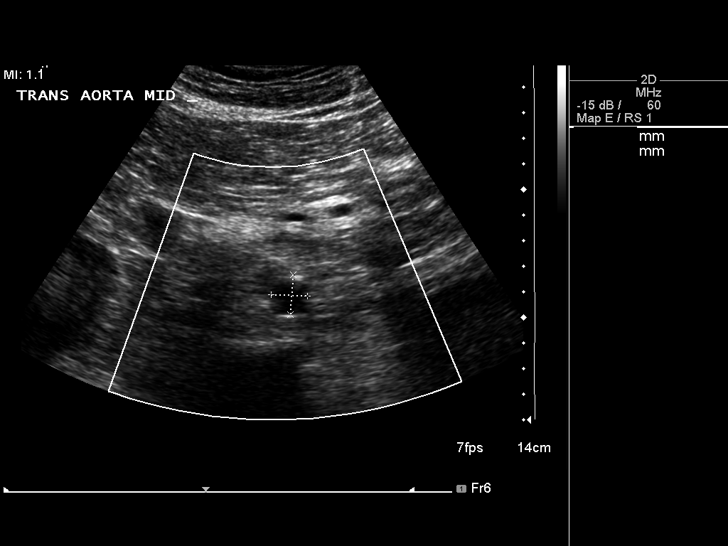
[im 11/28]
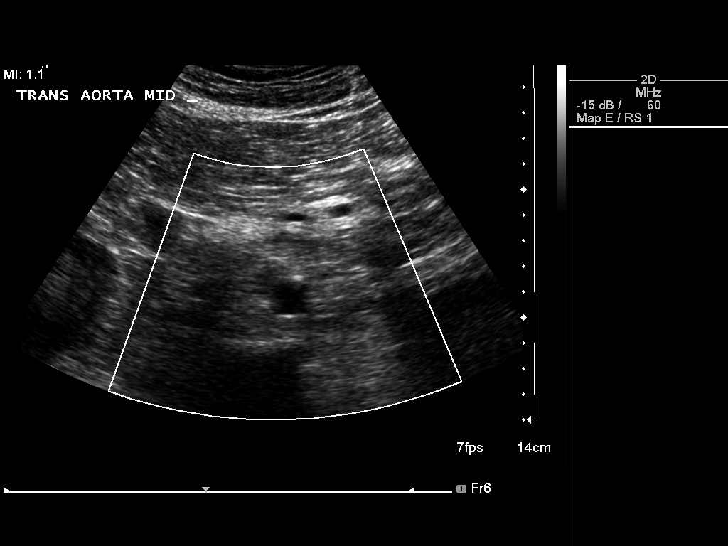
[im 13/28]
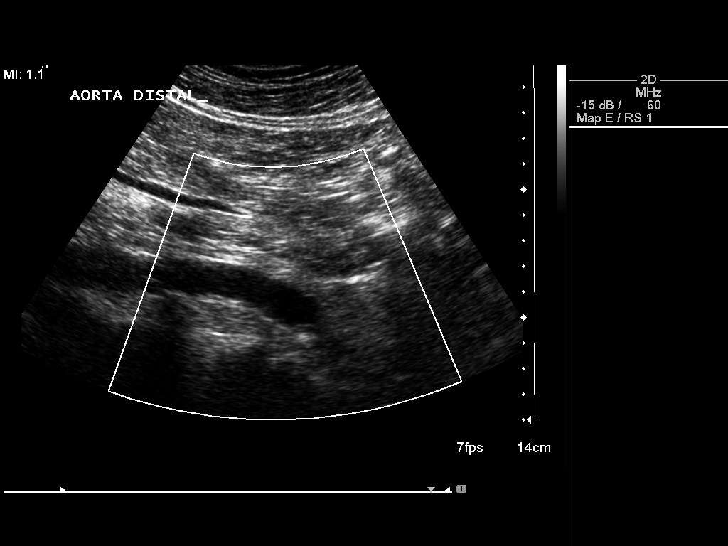
[im 15/28]
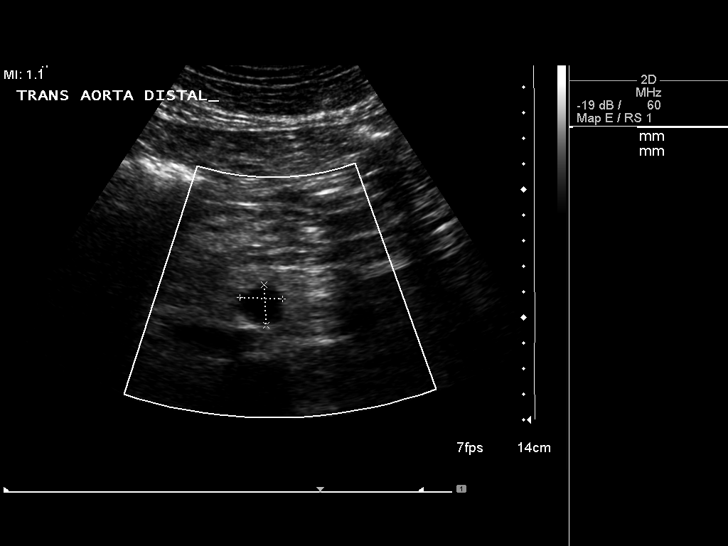
[im 17/28]
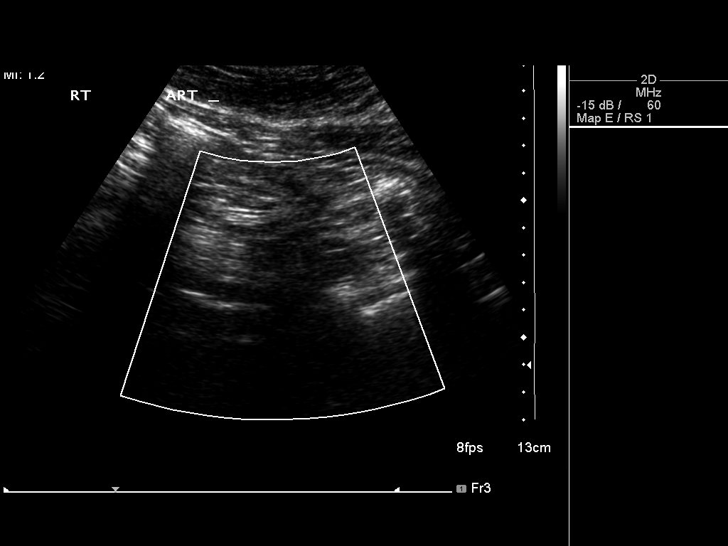
[im 19/28]
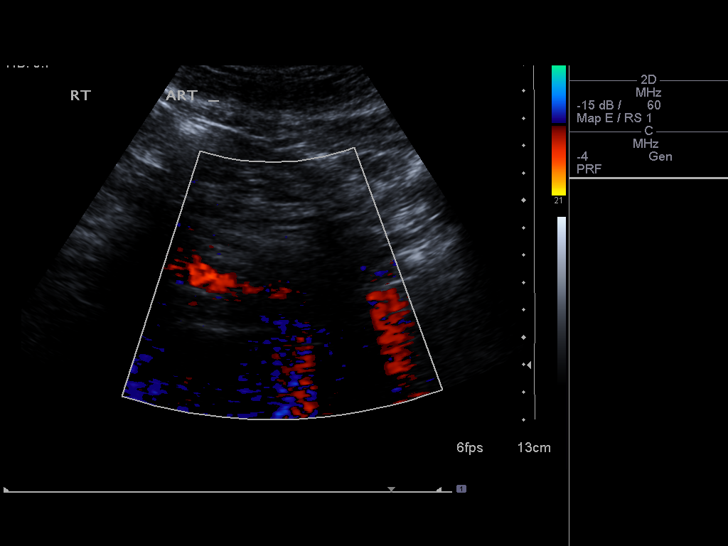
[im 21/28]
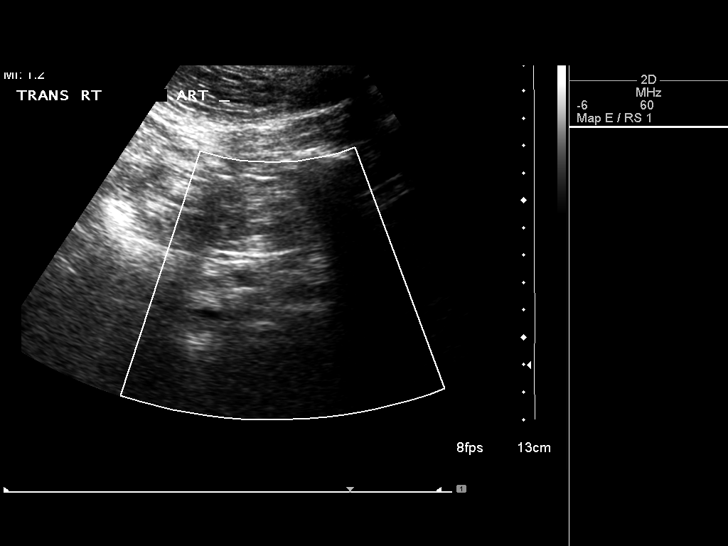
[im 23/28]
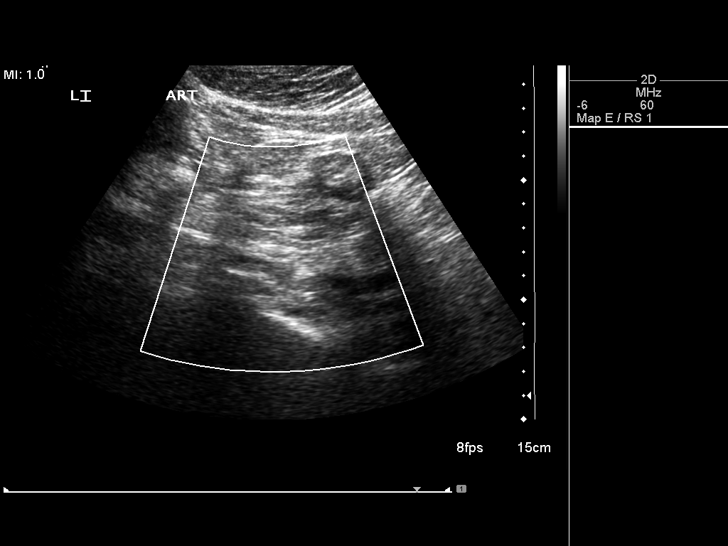
[im 25/28]
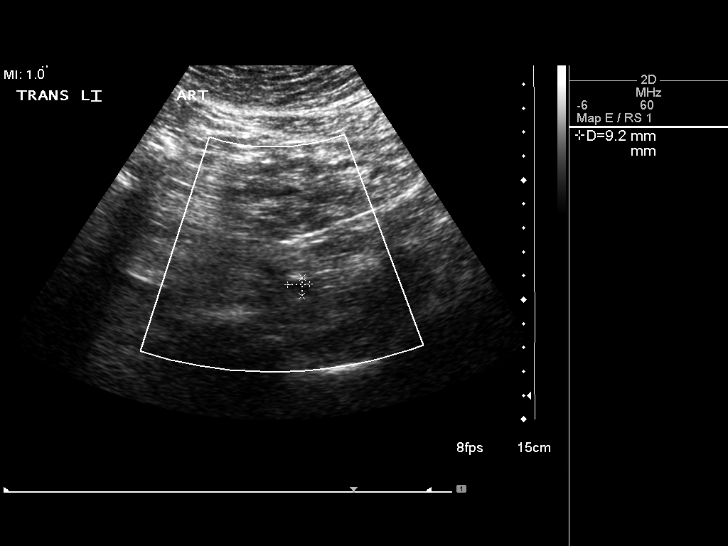
[im 28/28]
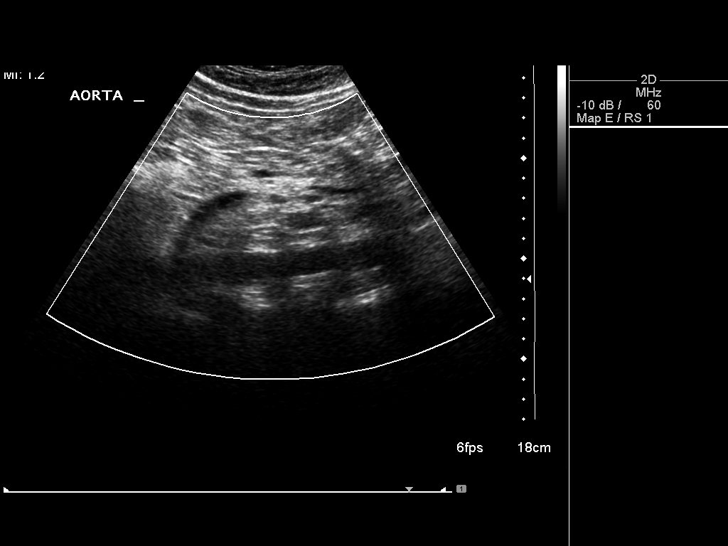

[14 of 25 positions shown; findings below may reference images not displayed]

FINDINGS: Abdominal Aorta

No aneurysm identified.  There is no periaortic fluid or adenopathy.

Maximum AP

Diameter:  2.4 cm proximal ; 1.6 cm mid; 1.5 cm distal

Maximum TRV

Diameter: 2.0 cm proximal ; 1.4 cm mid; 1.7 cm distal

No evidence of common iliac artery aneurysm on either side.
IMPRESSION: No demonstrable abdominal aortic aneurysm.

## 2015-01-09 DIAGNOSIS — E782 Mixed hyperlipidemia: Secondary | ICD-10-CM | POA: Diagnosis not present

## 2015-01-09 DIAGNOSIS — R0602 Shortness of breath: Secondary | ICD-10-CM | POA: Diagnosis not present

## 2015-01-09 DIAGNOSIS — D692 Other nonthrombocytopenic purpura: Secondary | ICD-10-CM | POA: Diagnosis not present

## 2015-01-09 DIAGNOSIS — N183 Chronic kidney disease, stage 3 (moderate): Secondary | ICD-10-CM | POA: Diagnosis not present

## 2015-01-09 DIAGNOSIS — E1122 Type 2 diabetes mellitus with diabetic chronic kidney disease: Secondary | ICD-10-CM | POA: Diagnosis not present

## 2015-01-09 DIAGNOSIS — E039 Hypothyroidism, unspecified: Secondary | ICD-10-CM | POA: Diagnosis not present

## 2015-01-09 DIAGNOSIS — Z720 Tobacco use: Secondary | ICD-10-CM | POA: Diagnosis not present

## 2015-01-20 ENCOUNTER — Telehealth: Payer: Self-pay | Admitting: Cardiology

## 2015-01-20 NOTE — Telephone Encounter (Signed)
01/15/2015 Received referral packet from [place] [facility] [place], from [place] for upcoming appointment with Dr. Stanford Breed.  Records given to [facility].

## 2015-04-08 NOTE — Progress Notes (Signed)
     HPI: 64 yo male for evaluation of dyspnea. Abd ultrasound 2/16 showed no aneurysm. Chest x-ray February 2016 showed no active cardiopulmonary disease. Patient did have an episode of chest pain this past February. It was right sided with no radiation. There was associated nausea and vomiting. He was seen in the emergency room and troponin negative. No chest pain since. He does have dyspnea on exertion but no orthopnea, PND, pedal edema or syncope. Cardiology now asked to evaluate for dyspnea.  Current Outpatient Prescriptions  Medication Sig Dispense Refill  . aspirin EC 81 MG tablet Take 81 mg by mouth daily.    Marland Kitchen levothyroxine (SYNTHROID, LEVOTHROID) 75 MCG tablet Take 75 mcg by mouth daily.  0  . metFORMIN (GLUCOPHAGE) 500 MG tablet Take 500 mg by mouth 2 (two) times daily with a meal.   5   No current facility-administered medications for this visit.    No Known Allergies   Past Medical History  Diagnosis Date  . Diabetes mellitus   . Hyperlipidemia   . Hypothyroid   . PUD (peptic ulcer disease)     Past Surgical History  Procedure Laterality Date  . Back surgery    . Knee surgery    . Tonsillectomy      History   Social History  . Marital Status: Divorced    Spouse Name: N/A  . Number of Children: 3  . Years of Education: N/A   Occupational History  .      Retired   Social History Main Topics  . Smoking status: Current Every Day Smoker    Types: E-cigarettes  . Smokeless tobacco: Not on file  . Alcohol Use: 3.0 - 3.6 oz/week    5-6 Standard drinks or equivalent per week     Comment: 5-6 drinks per week  . Drug Use: No  . Sexual Activity: Not on file   Other Topics Concern  . Not on file   Social History Narrative    Family History  Problem Relation Age of Onset  . CAD Mother     MI at age 34    ROS: knee arthralgias but no fevers or chills, productive cough, hemoptysis, dysphasia, odynophagia, melena, hematochezia, dysuria, hematuria, rash,  seizure activity, orthopnea, PND, pedal edema, claudication. Remaining systems are negative.  Physical Exam:   Blood pressure 140/58, pulse 55, height 5\' 8"  (1.727 m), weight 216 lb 6.4 oz (98.158 kg).  General:  Well developed/obese in NAD Skin warm/dry Patient not depressed No peripheral clubbing Back-normal HEENT-normal/normal eyelids Neck supple/normal carotid upstroke bilaterally; no bruits; no JVD; no thyromegaly chest - CTA/ normal expansion CV - RRR/normal S1 and S2; no murmurs, rubs or gallops;  PMI nondisplaced Abdomen -NT/ND, no HSM, no mass, + bowel sounds, no bruit 2+ femoral pulses, no bruits Ext-no edema, chords, 2+ DP Neuro-grossly nonfocal  ECG sinus bradycardia at a rate of 55. Right axis deviation. Nonspecific ST changes.

## 2015-04-11 ENCOUNTER — Ambulatory Visit (INDEPENDENT_AMBULATORY_CARE_PROVIDER_SITE_OTHER): Payer: Medicare Other | Admitting: Cardiology

## 2015-04-11 ENCOUNTER — Encounter: Payer: Self-pay | Admitting: Cardiology

## 2015-04-11 ENCOUNTER — Encounter: Payer: Self-pay | Admitting: *Deleted

## 2015-04-11 VITALS — BP 140/58 | HR 55 | Ht 68.0 in | Wt 216.4 lb

## 2015-04-11 DIAGNOSIS — R0600 Dyspnea, unspecified: Secondary | ICD-10-CM

## 2015-04-11 NOTE — Patient Instructions (Signed)
Your physician recommends that you schedule a follow-up appointment in:  AS NEEDED PENDING TEST RESULTS  Your physician has requested that you have a lexiscan myoview. For further information please visit [email or web address]. Please follow instruction sheet, as given.   

## 2015-04-11 NOTE — Assessment & Plan Note (Signed)
Management per primary care. 

## 2015-04-11 NOTE — Assessment & Plan Note (Signed)
Continue levothyroxine. Management per primary care.

## 2015-05-08 ENCOUNTER — Telehealth (HOSPITAL_COMMUNITY): Payer: Self-pay

## 2015-05-08 NOTE — Telephone Encounter (Signed)
Encounter complete. 

## 2015-05-11 IMAGING — CR DG CHEST 2V
2 series · 2 of 2 positions shown · non-contrast
Comparison: 04/16/2005

CLINICAL DATA: Chest pain

EXAM:
CHEST  2 VIEW

[chest lat]
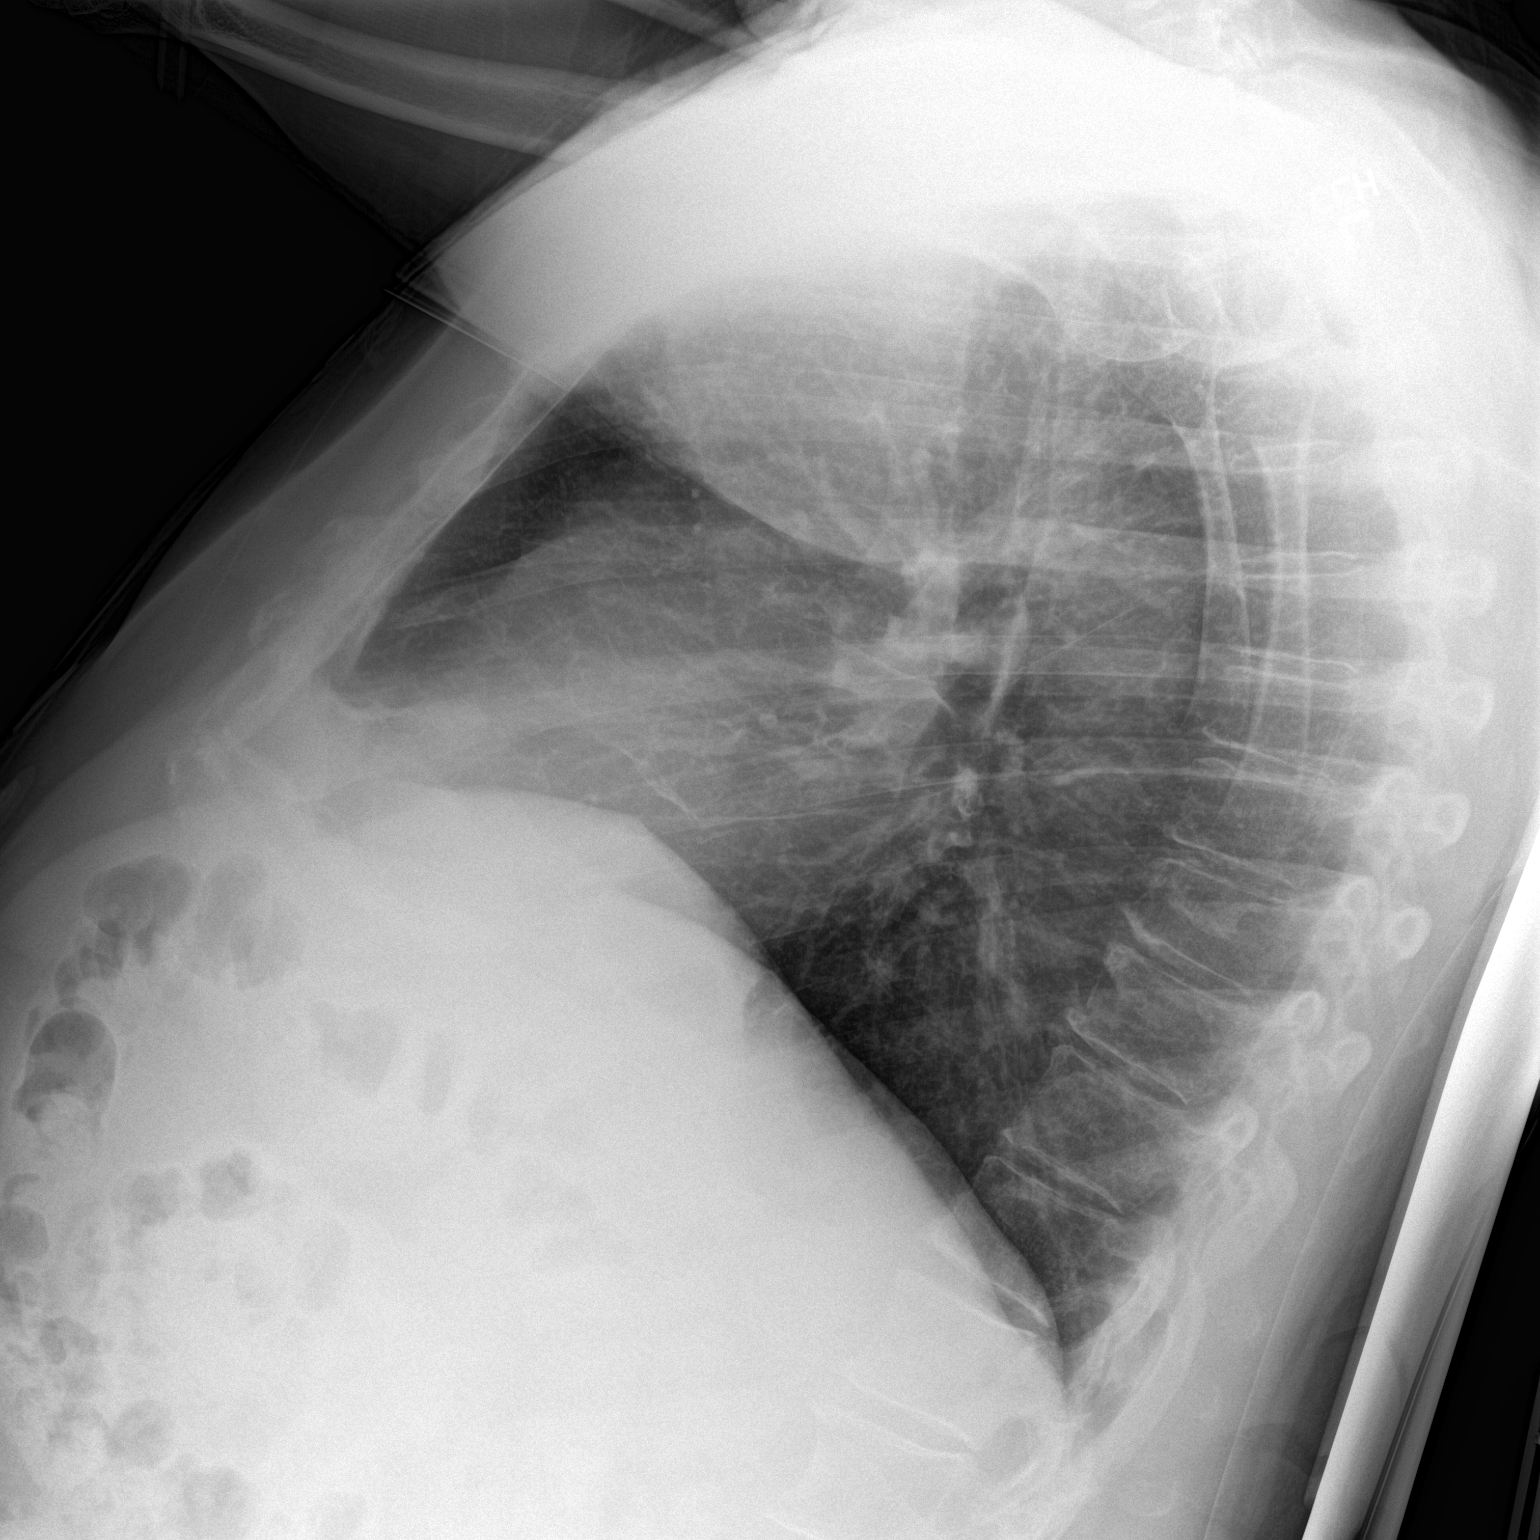

[chest ap]
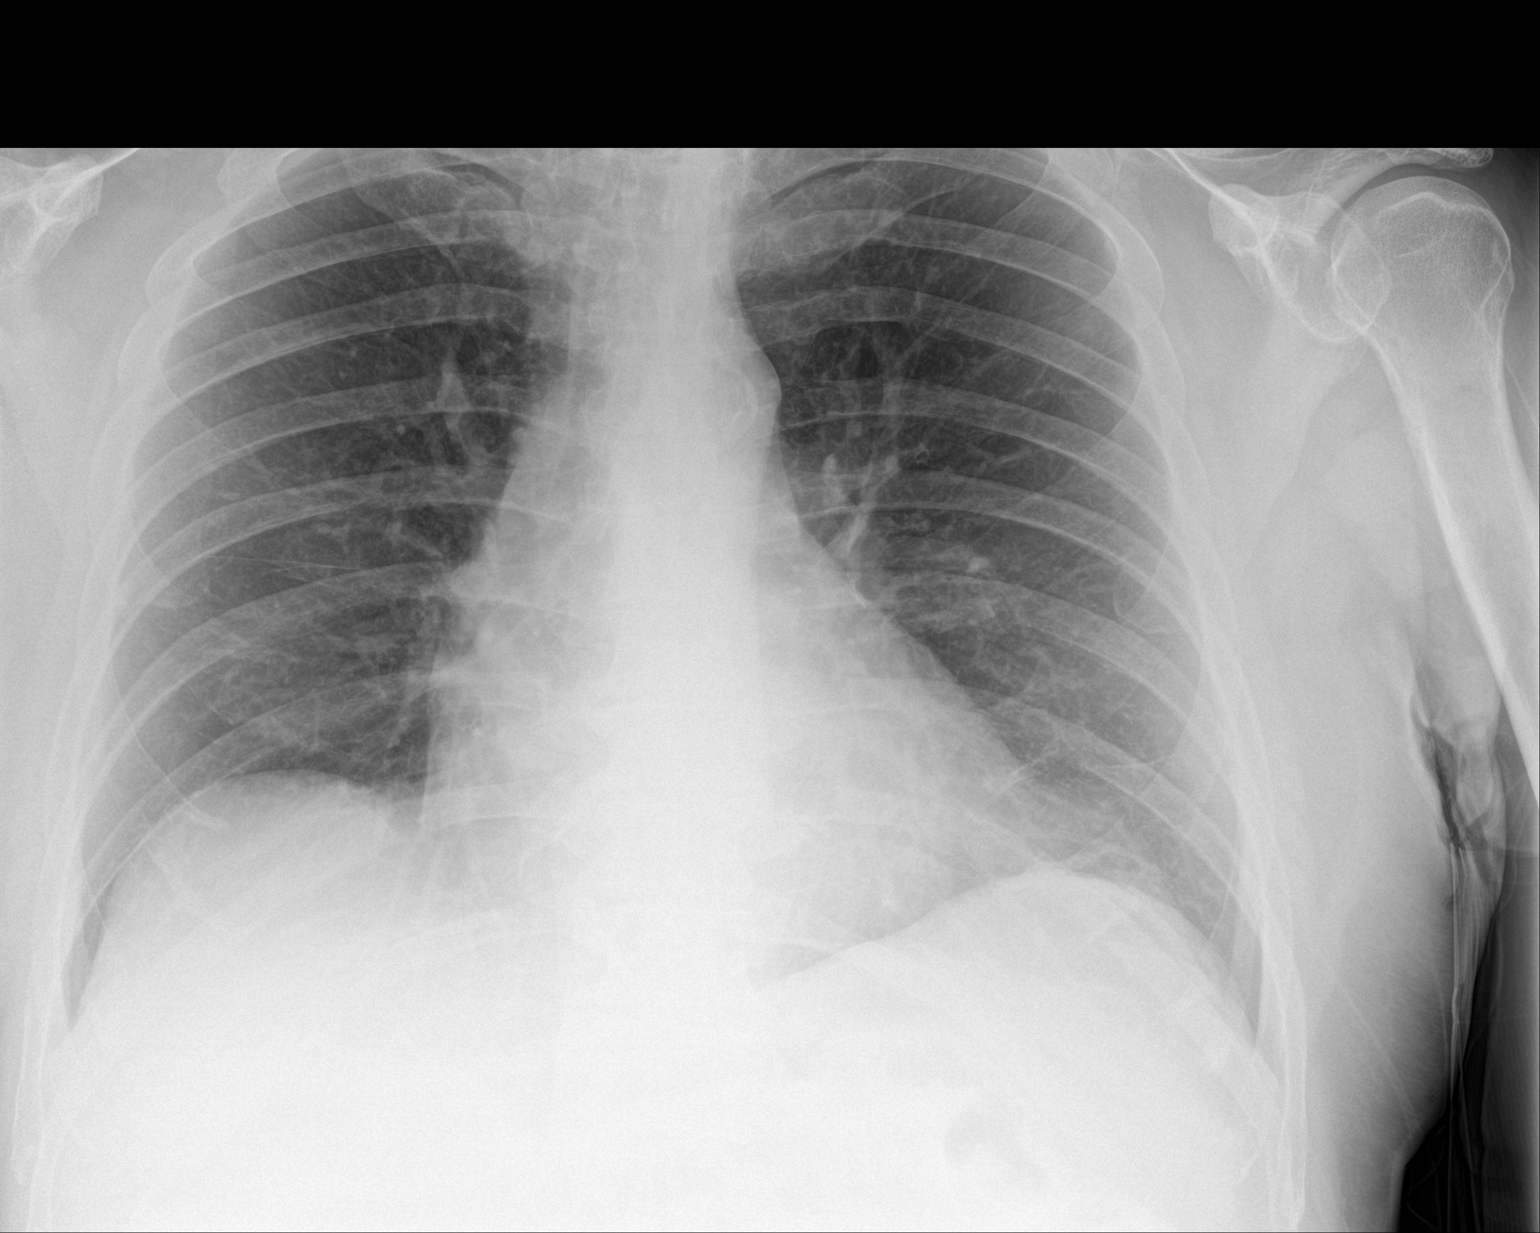

[2 of 2 positions shown; findings below may reference images not displayed]

FINDINGS: Normal heart size and mediastinal contours. No acute infiltrate or
edema. No effusion or pneumothorax. No acute osseous findings.
IMPRESSION: No active cardiopulmonary disease.

## 2015-05-11 IMAGING — US US ABDOMEN COMPLETE
1 series · 14 of 25 positions shown · non-contrast
Comparison: 07/15/2014

CLINICAL DATA: Diffuse abdominal pain with nausea and vomiting

EXAM:
ULTRASOUND ABDOMEN COMPLETE

[Series 1: us abdomen complete · 0.27mm/px · 14 of 76 slices shown]
[im 1/76]
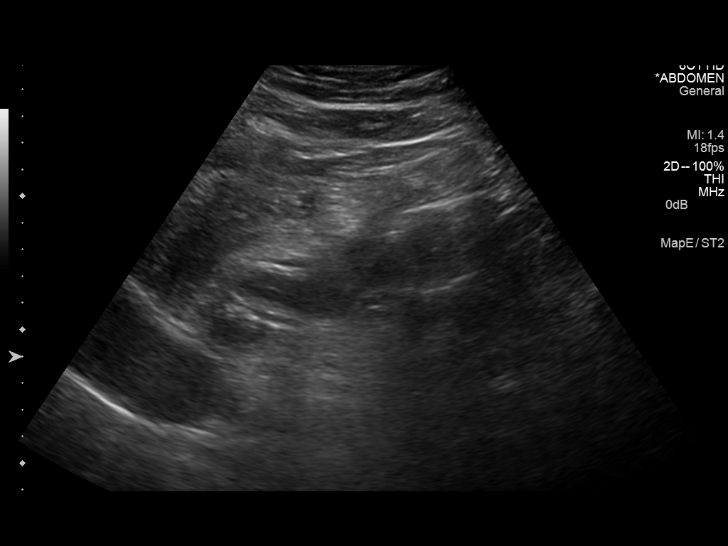
[im 7/76]
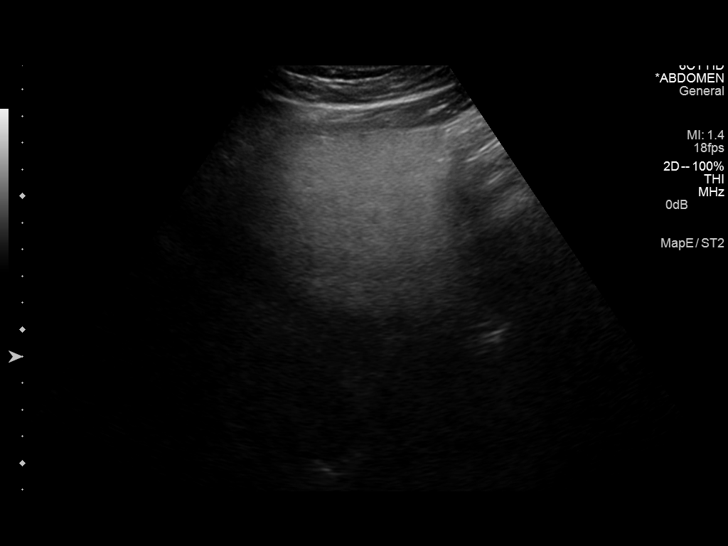
[im 13/76]
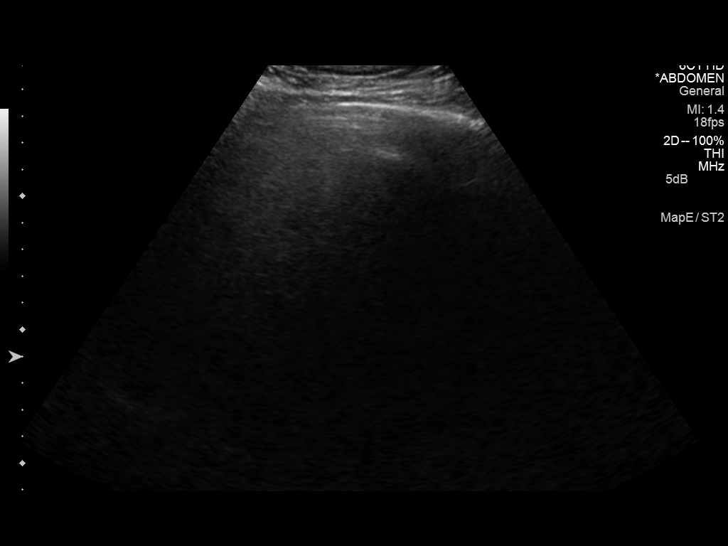
[im 19/76]
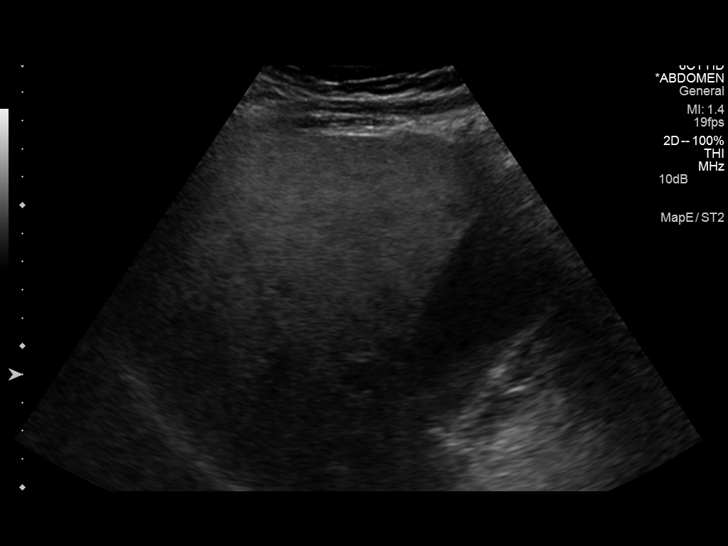
[im 26/76]
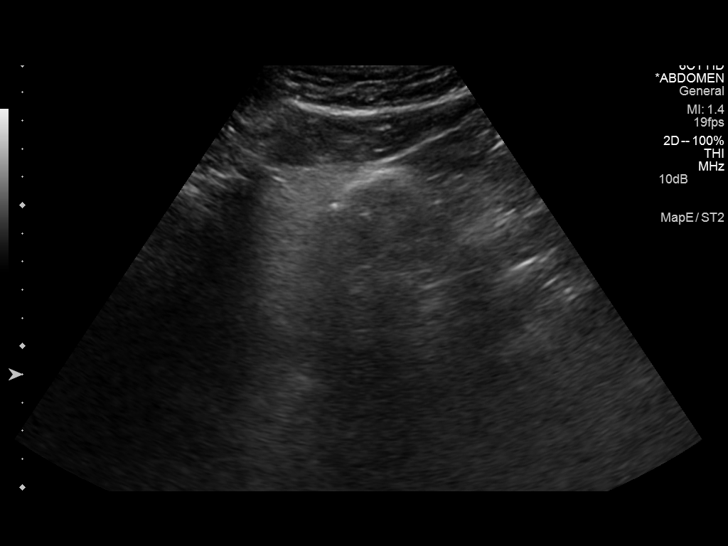
[im 29/76]
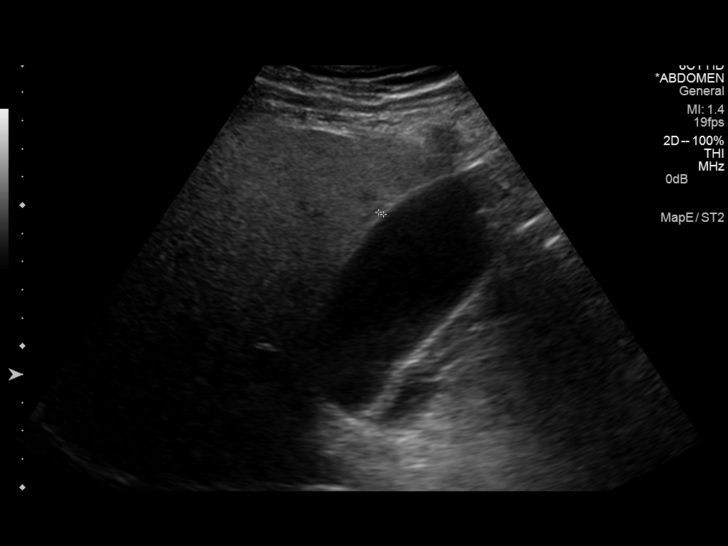
[im 35/76]
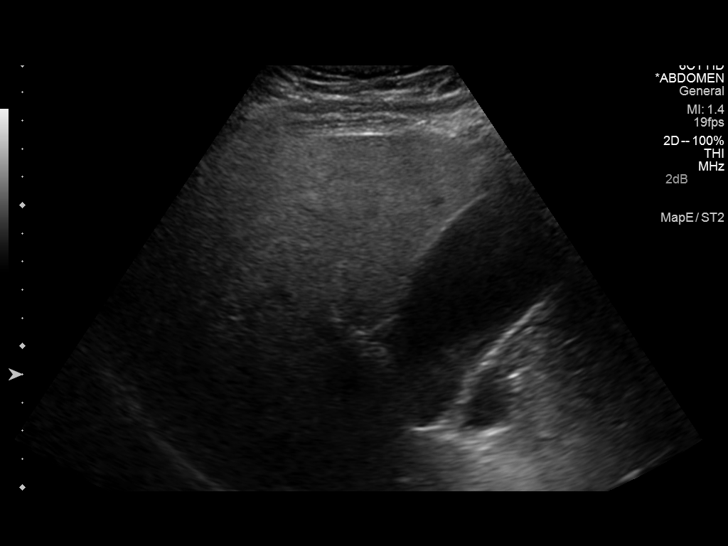
[im 41/76]
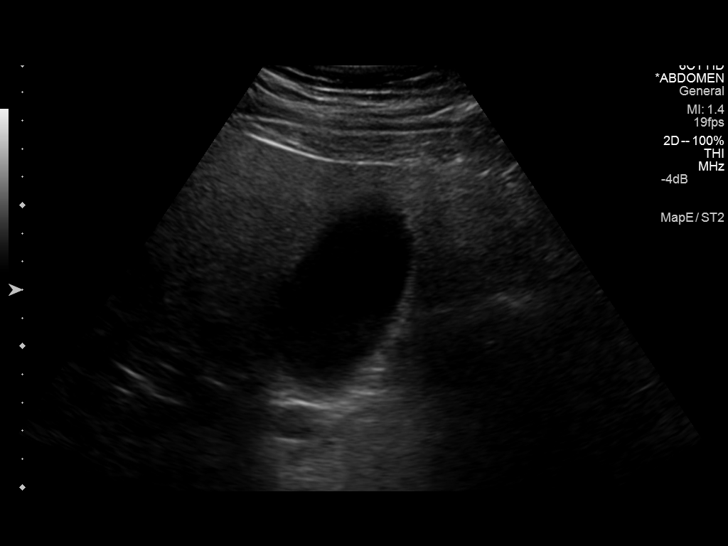
[im 47/76]
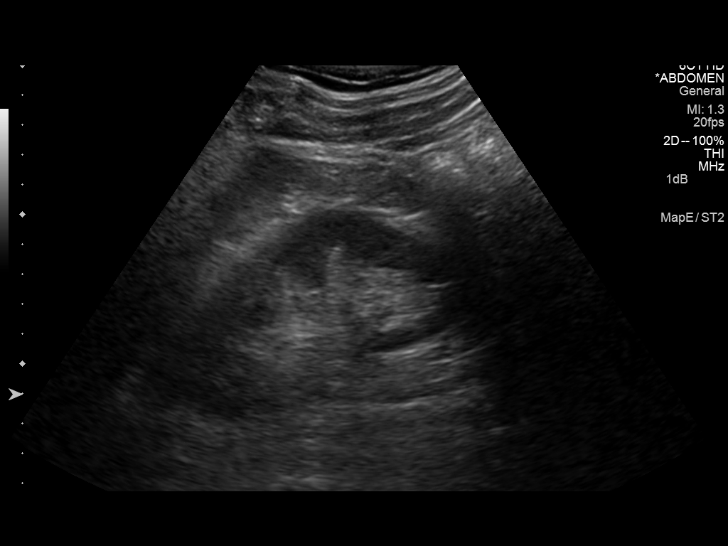
[im 51/76]
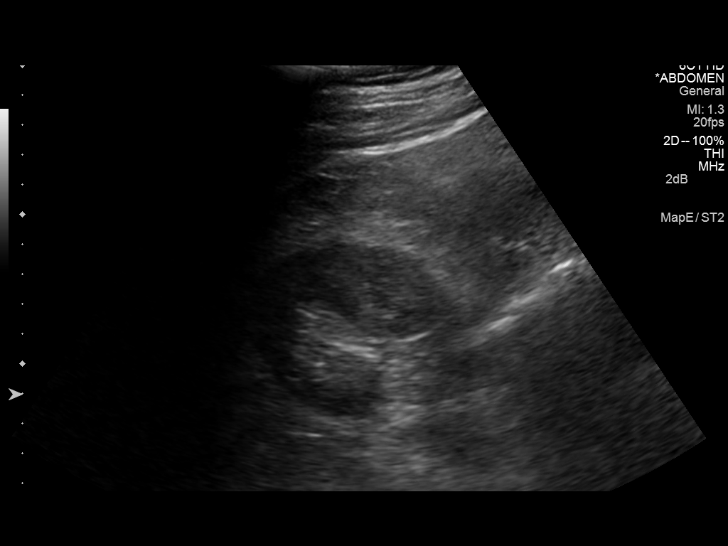
[im 57/76]
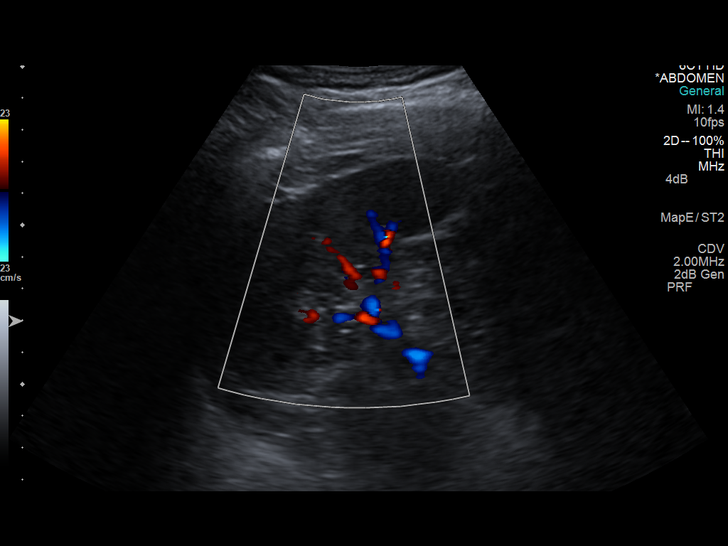
[im 63/76]
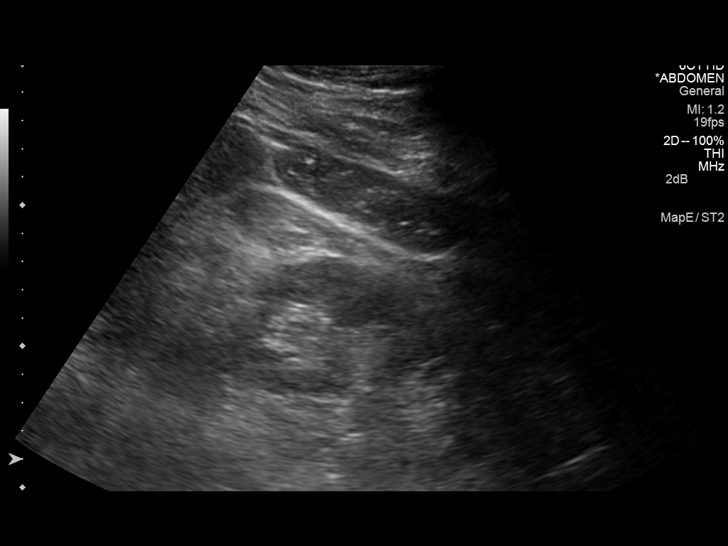
[im 69/76]
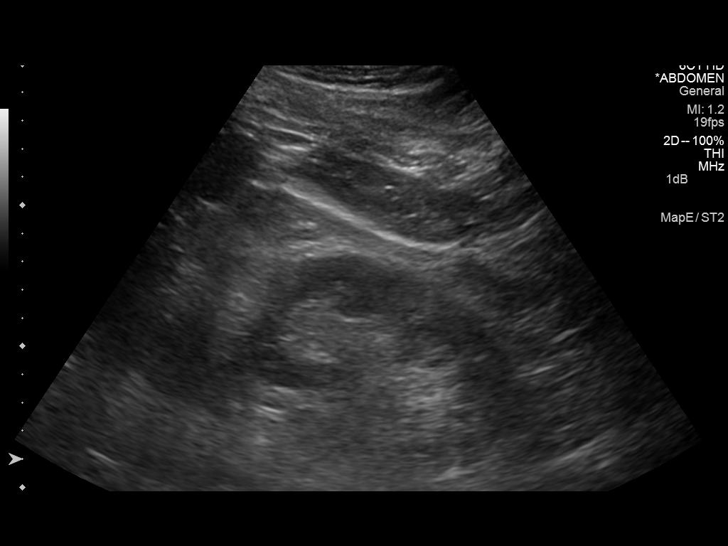
[im 76/76]
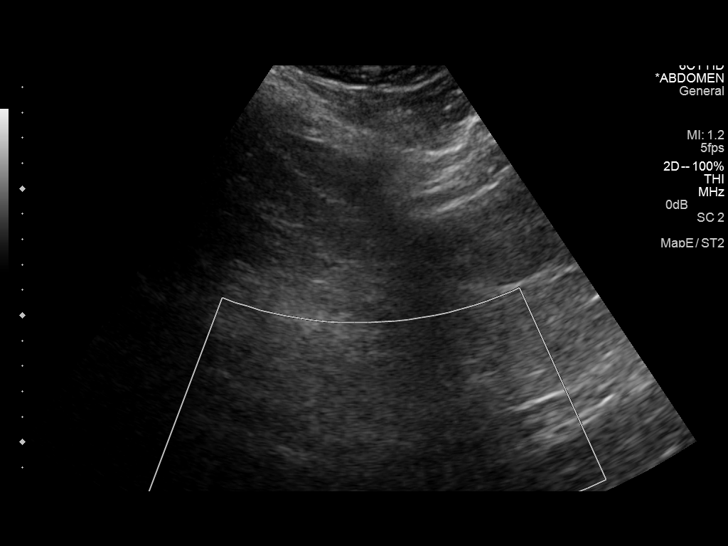

[14 of 25 positions shown; findings below may reference images not displayed]

FINDINGS: Gallbladder: Dependent mid level echoes consistent with sludge; no
visible stone. No wall thickening or focal tenderness per
sonographer exam

Common bile duct: Diameter: 5 mm.

Liver: Echogenic liver with poor acoustic transmission consistent
with steatosis. This findings decreases the sensitivity of
sonography in evaluating the liver. No focal lesion is seen. Noted
antegrade flow in the imaged portal venous system

IVC: Not visualized

Pancreas: Not visualized

Spleen: Size and appearance within normal limits.

Right Kidney: Length: 11.7 Cm. Echogenicity within normal limits. No
mass or hydronephrosis visualized.

Left Kidney: Length: 10 cm (likely underestimated). Echogenicity
within normal limits. No mass or hydronephrosis visualized.

Abdominal aorta: No aneurysm visualized.

Other findings: None.
IMPRESSION: 1. Hepatic steatosis.
2. Gallbladder sludge.
3. Nonvisualized pancreas due to bowel gas.

## 2015-05-13 ENCOUNTER — Inpatient Hospital Stay (HOSPITAL_COMMUNITY): Admission: RE | Admit: 2015-05-13 | Payer: Medicare Other | Source: Ambulatory Visit

## 2015-07-24 DIAGNOSIS — N183 Chronic kidney disease, stage 3 (moderate): Secondary | ICD-10-CM | POA: Diagnosis not present

## 2015-07-24 DIAGNOSIS — Z23 Encounter for immunization: Secondary | ICD-10-CM | POA: Diagnosis not present

## 2015-07-24 DIAGNOSIS — Z125 Encounter for screening for malignant neoplasm of prostate: Secondary | ICD-10-CM | POA: Diagnosis not present

## 2015-07-24 DIAGNOSIS — E1122 Type 2 diabetes mellitus with diabetic chronic kidney disease: Secondary | ICD-10-CM | POA: Diagnosis not present

## 2015-07-24 DIAGNOSIS — Z79899 Other long term (current) drug therapy: Secondary | ICD-10-CM | POA: Diagnosis not present

## 2015-07-24 DIAGNOSIS — Z8042 Family history of malignant neoplasm of prostate: Secondary | ICD-10-CM | POA: Diagnosis not present

## 2015-07-24 DIAGNOSIS — Z0000 Encounter for general adult medical examination without abnormal findings: Secondary | ICD-10-CM | POA: Diagnosis not present

## 2015-07-24 DIAGNOSIS — E039 Hypothyroidism, unspecified: Secondary | ICD-10-CM | POA: Diagnosis not present

## 2015-08-25 DIAGNOSIS — Z1211 Encounter for screening for malignant neoplasm of colon: Secondary | ICD-10-CM | POA: Diagnosis not present

## 2015-08-28 DIAGNOSIS — R972 Elevated prostate specific antigen [PSA]: Secondary | ICD-10-CM | POA: Diagnosis not present

## 2015-08-28 DIAGNOSIS — D7589 Other specified diseases of blood and blood-forming organs: Secondary | ICD-10-CM | POA: Diagnosis not present

## 2015-10-29 DIAGNOSIS — E559 Vitamin D deficiency, unspecified: Secondary | ICD-10-CM | POA: Diagnosis not present

## 2016-02-02 DIAGNOSIS — E559 Vitamin D deficiency, unspecified: Secondary | ICD-10-CM | POA: Diagnosis not present

## 2016-02-09 DIAGNOSIS — D7589 Other specified diseases of blood and blood-forming organs: Secondary | ICD-10-CM | POA: Diagnosis not present

## 2016-02-09 DIAGNOSIS — E039 Hypothyroidism, unspecified: Secondary | ICD-10-CM | POA: Diagnosis not present

## 2016-02-09 DIAGNOSIS — Z79899 Other long term (current) drug therapy: Secondary | ICD-10-CM | POA: Diagnosis not present

## 2016-02-09 DIAGNOSIS — E559 Vitamin D deficiency, unspecified: Secondary | ICD-10-CM | POA: Diagnosis not present

## 2016-02-09 DIAGNOSIS — N183 Chronic kidney disease, stage 3 (moderate): Secondary | ICD-10-CM | POA: Diagnosis not present

## 2016-02-09 DIAGNOSIS — E1122 Type 2 diabetes mellitus with diabetic chronic kidney disease: Secondary | ICD-10-CM | POA: Diagnosis not present

## 2016-02-17 DIAGNOSIS — E538 Deficiency of other specified B group vitamins: Secondary | ICD-10-CM | POA: Diagnosis not present

## 2016-03-30 DIAGNOSIS — E538 Deficiency of other specified B group vitamins: Secondary | ICD-10-CM | POA: Diagnosis not present

## 2016-03-30 DIAGNOSIS — D7589 Other specified diseases of blood and blood-forming organs: Secondary | ICD-10-CM | POA: Diagnosis not present

## 2016-09-08 DIAGNOSIS — N401 Enlarged prostate with lower urinary tract symptoms: Secondary | ICD-10-CM | POA: Diagnosis not present

## 2016-09-08 DIAGNOSIS — I10 Essential (primary) hypertension: Secondary | ICD-10-CM | POA: Diagnosis not present

## 2016-09-08 DIAGNOSIS — Z79899 Other long term (current) drug therapy: Secondary | ICD-10-CM | POA: Diagnosis not present

## 2016-09-08 DIAGNOSIS — R3911 Hesitancy of micturition: Secondary | ICD-10-CM | POA: Diagnosis not present

## 2016-09-08 DIAGNOSIS — E039 Hypothyroidism, unspecified: Secondary | ICD-10-CM | POA: Diagnosis not present

## 2016-09-08 DIAGNOSIS — E1122 Type 2 diabetes mellitus with diabetic chronic kidney disease: Secondary | ICD-10-CM | POA: Diagnosis not present

## 2016-09-08 DIAGNOSIS — E559 Vitamin D deficiency, unspecified: Secondary | ICD-10-CM | POA: Diagnosis not present

## 2016-09-08 DIAGNOSIS — Z23 Encounter for immunization: Secondary | ICD-10-CM | POA: Diagnosis not present

## 2016-09-08 DIAGNOSIS — N183 Chronic kidney disease, stage 3 (moderate): Secondary | ICD-10-CM | POA: Diagnosis not present

## 2016-09-08 DIAGNOSIS — Z1159 Encounter for screening for other viral diseases: Secondary | ICD-10-CM | POA: Diagnosis not present

## 2016-09-08 DIAGNOSIS — Z0000 Encounter for general adult medical examination without abnormal findings: Secondary | ICD-10-CM | POA: Diagnosis not present

## 2016-09-08 DIAGNOSIS — E538 Deficiency of other specified B group vitamins: Secondary | ICD-10-CM | POA: Diagnosis not present

## 2016-09-16 DIAGNOSIS — Z1211 Encounter for screening for malignant neoplasm of colon: Secondary | ICD-10-CM | POA: Diagnosis not present

## 2016-10-19 DIAGNOSIS — E1122 Type 2 diabetes mellitus with diabetic chronic kidney disease: Secondary | ICD-10-CM | POA: Diagnosis not present

## 2016-10-19 DIAGNOSIS — J209 Acute bronchitis, unspecified: Secondary | ICD-10-CM | POA: Diagnosis not present

## 2016-10-19 DIAGNOSIS — Z7984 Long term (current) use of oral hypoglycemic drugs: Secondary | ICD-10-CM | POA: Diagnosis not present

## 2017-09-27 DIAGNOSIS — Z79899 Other long term (current) drug therapy: Secondary | ICD-10-CM | POA: Diagnosis not present

## 2017-09-27 DIAGNOSIS — E782 Mixed hyperlipidemia: Secondary | ICD-10-CM | POA: Diagnosis not present

## 2017-09-27 DIAGNOSIS — E039 Hypothyroidism, unspecified: Secondary | ICD-10-CM | POA: Diagnosis not present

## 2017-09-27 DIAGNOSIS — E559 Vitamin D deficiency, unspecified: Secondary | ICD-10-CM | POA: Diagnosis not present

## 2017-09-27 DIAGNOSIS — E1165 Type 2 diabetes mellitus with hyperglycemia: Secondary | ICD-10-CM | POA: Diagnosis not present

## 2017-09-27 DIAGNOSIS — Z1211 Encounter for screening for malignant neoplasm of colon: Secondary | ICD-10-CM | POA: Diagnosis not present

## 2017-09-27 DIAGNOSIS — J069 Acute upper respiratory infection, unspecified: Secondary | ICD-10-CM | POA: Diagnosis not present

## 2017-09-27 DIAGNOSIS — E1122 Type 2 diabetes mellitus with diabetic chronic kidney disease: Secondary | ICD-10-CM | POA: Diagnosis not present

## 2017-09-27 DIAGNOSIS — N183 Chronic kidney disease, stage 3 (moderate): Secondary | ICD-10-CM | POA: Diagnosis not present

## 2017-09-27 DIAGNOSIS — I10 Essential (primary) hypertension: Secondary | ICD-10-CM | POA: Diagnosis not present

## 2017-09-27 DIAGNOSIS — Z7984 Long term (current) use of oral hypoglycemic drugs: Secondary | ICD-10-CM | POA: Diagnosis not present

## 2017-09-27 DIAGNOSIS — N401 Enlarged prostate with lower urinary tract symptoms: Secondary | ICD-10-CM | POA: Diagnosis not present

## 2017-09-27 DIAGNOSIS — Z0000 Encounter for general adult medical examination without abnormal findings: Secondary | ICD-10-CM | POA: Diagnosis not present

## 2017-09-27 DIAGNOSIS — E538 Deficiency of other specified B group vitamins: Secondary | ICD-10-CM | POA: Diagnosis not present

## 2017-09-27 DIAGNOSIS — Z23 Encounter for immunization: Secondary | ICD-10-CM | POA: Diagnosis not present

## 2017-10-14 DIAGNOSIS — K219 Gastro-esophageal reflux disease without esophagitis: Secondary | ICD-10-CM | POA: Diagnosis not present

## 2017-10-14 DIAGNOSIS — Z1211 Encounter for screening for malignant neoplasm of colon: Secondary | ICD-10-CM | POA: Diagnosis not present

## 2017-11-10 DIAGNOSIS — D126 Benign neoplasm of colon, unspecified: Secondary | ICD-10-CM | POA: Diagnosis not present

## 2017-11-10 DIAGNOSIS — Z1211 Encounter for screening for malignant neoplasm of colon: Secondary | ICD-10-CM | POA: Diagnosis not present

## 2017-11-10 DIAGNOSIS — K5730 Diverticulosis of large intestine without perforation or abscess without bleeding: Secondary | ICD-10-CM | POA: Diagnosis not present

## 2017-11-15 DIAGNOSIS — Z1211 Encounter for screening for malignant neoplasm of colon: Secondary | ICD-10-CM | POA: Diagnosis not present

## 2017-11-15 DIAGNOSIS — D126 Benign neoplasm of colon, unspecified: Secondary | ICD-10-CM | POA: Diagnosis not present

## 2018-06-14 DIAGNOSIS — Z23 Encounter for immunization: Secondary | ICD-10-CM | POA: Diagnosis not present

## 2018-10-31 DIAGNOSIS — M1990 Unspecified osteoarthritis, unspecified site: Secondary | ICD-10-CM | POA: Diagnosis not present

## 2018-10-31 DIAGNOSIS — N183 Chronic kidney disease, stage 3 (moderate): Secondary | ICD-10-CM | POA: Diagnosis not present

## 2018-10-31 DIAGNOSIS — Z0000 Encounter for general adult medical examination without abnormal findings: Secondary | ICD-10-CM | POA: Diagnosis not present

## 2018-10-31 DIAGNOSIS — E039 Hypothyroidism, unspecified: Secondary | ICD-10-CM | POA: Diagnosis not present

## 2018-10-31 DIAGNOSIS — I10 Essential (primary) hypertension: Secondary | ICD-10-CM | POA: Diagnosis not present

## 2018-10-31 DIAGNOSIS — K219 Gastro-esophageal reflux disease without esophagitis: Secondary | ICD-10-CM | POA: Diagnosis not present

## 2018-10-31 DIAGNOSIS — D7589 Other specified diseases of blood and blood-forming organs: Secondary | ICD-10-CM | POA: Diagnosis not present

## 2018-10-31 DIAGNOSIS — E538 Deficiency of other specified B group vitamins: Secondary | ICD-10-CM | POA: Diagnosis not present

## 2018-10-31 DIAGNOSIS — E1122 Type 2 diabetes mellitus with diabetic chronic kidney disease: Secondary | ICD-10-CM | POA: Diagnosis not present

## 2018-10-31 DIAGNOSIS — N401 Enlarged prostate with lower urinary tract symptoms: Secondary | ICD-10-CM | POA: Diagnosis not present

## 2018-10-31 DIAGNOSIS — Z125 Encounter for screening for malignant neoplasm of prostate: Secondary | ICD-10-CM | POA: Diagnosis not present

## 2018-10-31 DIAGNOSIS — Z79899 Other long term (current) drug therapy: Secondary | ICD-10-CM | POA: Diagnosis not present

## 2018-12-06 ENCOUNTER — Encounter: Payer: Medicare Other | Admitting: Neurology

## 2019-06-13 DIAGNOSIS — Z23 Encounter for immunization: Secondary | ICD-10-CM | POA: Diagnosis not present

## 2019-06-28 DIAGNOSIS — E119 Type 2 diabetes mellitus without complications: Secondary | ICD-10-CM | POA: Diagnosis not present

## 2019-06-28 DIAGNOSIS — H4321 Crystalline deposits in vitreous body, right eye: Secondary | ICD-10-CM | POA: Diagnosis not present

## 2019-06-28 DIAGNOSIS — H2513 Age-related nuclear cataract, bilateral: Secondary | ICD-10-CM | POA: Diagnosis not present

## 2019-06-28 DIAGNOSIS — H0288A Meibomian gland dysfunction right eye, upper and lower eyelids: Secondary | ICD-10-CM | POA: Diagnosis not present

## 2019-06-28 DIAGNOSIS — H0288B Meibomian gland dysfunction left eye, upper and lower eyelids: Secondary | ICD-10-CM | POA: Diagnosis not present

## 2019-06-28 DIAGNOSIS — H353131 Nonexudative age-related macular degeneration, bilateral, early dry stage: Secondary | ICD-10-CM | POA: Diagnosis not present

## 2020-02-07 ENCOUNTER — Other Ambulatory Visit: Payer: Self-pay

## 2020-02-07 ENCOUNTER — Ambulatory Visit: Payer: Medicare Other | Admitting: Neurology

## 2020-02-07 ENCOUNTER — Telehealth: Payer: Self-pay | Admitting: Neurology

## 2020-02-07 ENCOUNTER — Encounter: Payer: Self-pay | Admitting: Neurology

## 2020-02-07 VITALS — BP 133/79 | HR 56 | Ht 66.0 in | Wt 210.0 lb

## 2020-02-07 DIAGNOSIS — R269 Unspecified abnormalities of gait and mobility: Secondary | ICD-10-CM | POA: Diagnosis not present

## 2020-02-07 DIAGNOSIS — R202 Paresthesia of skin: Secondary | ICD-10-CM | POA: Diagnosis not present

## 2020-02-07 DIAGNOSIS — R531 Weakness: Secondary | ICD-10-CM | POA: Insufficient documentation

## 2020-02-07 DIAGNOSIS — R3915 Urgency of urination: Secondary | ICD-10-CM | POA: Diagnosis not present

## 2020-02-07 MED ORDER — DULOXETINE HCL 60 MG PO CPEP: 60.0000 mg | ORAL_CAPSULE | Freq: Every day | ORAL | 12 refills | Status: DC

## 2020-02-07 NOTE — Telephone Encounter (Signed)
[employer] medicare order sent to GI. No auth they will reach out to the patient to schedule.  

## 2020-02-07 NOTE — Progress Notes (Signed)
PATIENT: Leonard Walton DOB: 26-Aug-1951  Chief Complaint  Patient presents with  . Numbness    Reports numbness and tingling in his bilateral hands and lower extremities (below his knees).   . PCP     Leonard Jordan, MD      Leonard Walton is a 69 year old male, seen in request by his primary care physician Dr. Stephanie Walton, Leonard Walton, for evaluation of bilateral upper and lower extremity paresthesia, weakness, gait abnormality, initial evaluation was on Feb 07, 2020.  I have reviewed and summarized the referring note from the referring physician.  He had past medical history of obesity, hyperlipidemia, diabetes for more than 10 years, history of lumbar decompression surgery in 2009 for low back pain, radicular pain to bilateral lower extremity, which did help his low back pain, but he still have intermittent deep achy pain travels through his right leg, in addition, he has significant right knee pain,  He complains of gradual onset bilateral lower extremity paresthesia for more than 5 years, involving whole foot, below ankle level, also noticed intermittent bilateral hand finger numbness, right side involving first 2 fingers, left side involving the third through fifth fingers, gradual onset mild bilateral hand muscle weakness, dropping tools from his hand, since 2020, he noticed worsening gait abnormality, partially contributed to her worsening right knee pain, also bilateral feet numbness, he has worsening urinary urgency, he cannot hold it anymore.  He continues to have intermittent low back pain, denies significant neck pain  Laboratory evaluation in April 2021, elevated triglycerides 311, LDL 79, B12 was normal, A1c was 7.1, TSH was mildly elevated 4.72, normal CMP with exception of elevated glucose 133, normal CBC, hemoglobin of 17.7  REVIEW OF SYSTEMS: Full 14 system review of systems performed and notable only for as above. All other review of systems were  negative.  ALLERGIES: No Known Allergies  HOME MEDICATIONS: Current Outpatient Medications  Medication Sig Dispense Refill  . aspirin EC 81 MG tablet Take 81 mg by mouth daily.    Marland Kitchen b complex vitamins capsule Take 1 capsule by mouth daily.    Marland Kitchen levothyroxine (SYNTHROID, LEVOTHROID) 75 MCG tablet Take 75 mcg by mouth daily.  0  . metFORMIN (GLUCOPHAGE) 500 MG tablet Take 500 mg by mouth 2 (two) times daily with a meal.   5  . rosuvastatin (CRESTOR) 5 MG tablet Take 5 mg by mouth daily.    . tamsulosin (FLOMAX) 0.4 MG CAPS capsule Take 0.4 mg by mouth daily.    Marland Kitchen VITAMIN D PO Take 2,000 Units by mouth daily.     No current facility-administered medications for this visit.    PAST MEDICAL HISTORY: Past Medical History:  Diagnosis Date  . BPH (benign prostatic hyperplasia)   . Depression   . Diabetes mellitus ([place])   . Hyperlipidemia   . Hypothyroid   . Low back pain   . Low serum vitamin B12   . Numbness   . PUD (peptic ulcer disease)     PAST SURGICAL HISTORY: Past Surgical History:  Procedure Laterality Date  . BACK SURGERY  2009   laminectomy - Dr. Hal Neer  . COLONOSCOPY    . KNEE SURGERY     x 2  . TONSILLECTOMY      FAMILY HISTORY: Family History  Problem Relation Age of Onset  . CAD Mother        MI at age 36  . Heart attack Mother   . Lung cancer Father   .  Throat cancer Sister   . Prostate cancer Brother   . Ovarian cancer Sister     SOCIAL HISTORY: Social History   Socioeconomic History  . Marital status: Divorced    Spouse name: Not on file  . Number of children: 3  . Years of education: college  . Highest education level: Associate degree: academic program  Occupational History  . Occupation: retired from [employer]  Tobacco Use  . Smoking status: Current Every Day Smoker    Packs/day: 0.50    Types: E-cigarettes, Cigarettes  . Smokeless tobacco: Never Used  Substance and Sexual Activity  . Alcohol use: Yes    Comment: 2-3 drinks per day   . Drug use: No  . Sexual activity: Not on file  Other Topics Concern  . Not on file  Social History Narrative   Lives alone.   Right-handed.   Occasional soda.   Social Determinants of Health   Financial Resource Strain:   . Difficulty of Paying Living Expenses:   Food Insecurity:   . Worried About Charity fundraiser in the Last Year:   . Arboriculturist in the Last Year:   Transportation Needs:   . Film/video editor (Medical):   Marland Kitchen Lack of Transportation (Non-Medical):   Physical Activity:   . Days of Exercise per Week:   . Minutes of Exercise per Session:   Stress:   . Feeling of Stress :   Social Connections:   . Frequency of Communication with Friends and Family:   . Frequency of Social Gatherings with Friends and Family:   . Attends Religious Services:   . Active Member of Clubs or Organizations:   . Attends Archivist Meetings:   Marland Kitchen Marital Status:   Intimate Partner Violence:   . Fear of Current or Ex-Partner:   . Emotionally Abused:   Marland Kitchen Physically Abused:   . Sexually Abused:      PHYSICAL EXAM   Vitals:   02/07/20 0747  BP: 133/79  Pulse: (!) 56  Weight: 210 lb (95.3 kg)  Height: 5\' 6"  (1.676 m)    Not recorded      Body mass index is 33.89 kg/m.  PHYSICAL EXAMNIATION:  Gen: NAD, conversant, well nourised, well groomed                     Cardiovascular: Regular rate rhythm, no peripheral edema, warm, nontender. Eyes: Conjunctivae clear without exudates or hemorrhage Neck: Supple, no carotid bruits. Pulmonary: Clear to auscultation bilaterally   NEUROLOGICAL EXAM:  MENTAL STATUS: Speech:    Speech is normal; fluent and spontaneous with normal comprehension.  Cognition:     Orientation to time, place and person     Normal recent and remote memory     Normal Attention span and concentration     Normal Language, naming, repeating,spontaneous speech     Fund of knowledge   CRANIAL NERVES: CN II: Visual fields are full to  confrontation. Pupils are round equal and briskly reactive to light. CN III, IV, VI: extraocular movement are normal. No ptosis. CN V: Facial sensation is intact to light touch CN VII: Face is symmetric with normal eye closure  CN VIII: Hearing is normal to causal conversation. CN IX, X: Phonation is normal. CN XI: Head turning and shoulder shrug are intact  MOTOR: There was no significant bilateral upper extremity proximal muscle weakness, he has mild bilateral finger abduction weakness, left worse than right, mild left  hand intrinsic muscle atrophy.  Bilateral lower extremity proximal muscle strength was normal, he has mild to moderate bilateral toe flexion, extension weakness.  REFLEXES: Reflexes are 2+ and symmetric at the biceps, triceps, 3/3 at knees, and absent at ankles. Plantar responses are flexor.  SENSORY: Intact to light touch, pinprick and vibratory sensation are intact in fingers and toes.  COORDINATION: There is no trunk or limb dysmetria noted.  GAIT/STANCE: He can get up from seated position arms crossed, antalgic, unsteady, has difficulty standing up on tiptoe, heels DIAGNOSTIC DATA (LABS, IMAGING, TESTING) - I reviewed patient records, labs, notes, testing and imaging myself where available.   ASSESSMENT AND PLAN  CORDARO RENSBERGER is a 69 y.o. male   Chronic low back pain, previous history of lumbar decompression surgery for right lumbar radiculopathy Progressive worsening bilateral lower extremity paresthesia, gait abnormality, urinary urgency, bilateral finger paresthesia, mild bilateral hand muscle weakness  On examination, he has mild intrinsic hand muscle atrophy, weakness, left worse than right, moderate bilateral toe flexion, extension weakness, hyperreflexia of triceps, bilateral knee, absent at ankles, antalgic, unsteady gait,  Differentiation diagnosis include lumbar radiculopathy, with superimposed cervical myelopathy, peripheral neuropathy  Complete  evaluation with MRI of cervical spine, lumbar spine, EMG nerve conduction study  Add on Cymbalta 60 mg daily   Marcial Pacas, M.D. Ph.D.  [facility] [address] [place],  [address] Ph: [phone] Fax: [phone]  CC: Leonard Jordan, MD

## 2020-02-29 ENCOUNTER — Other Ambulatory Visit: Payer: Self-pay | Admitting: Neurology

## 2020-03-03 ENCOUNTER — Encounter: Payer: Medicare Other | Admitting: Neurology

## 2021-01-26 DIAGNOSIS — Z7984 Long term (current) use of oral hypoglycemic drugs: Secondary | ICD-10-CM | POA: Diagnosis not present

## 2021-01-26 DIAGNOSIS — Z79899 Other long term (current) drug therapy: Secondary | ICD-10-CM | POA: Diagnosis not present

## 2021-01-26 DIAGNOSIS — E559 Vitamin D deficiency, unspecified: Secondary | ICD-10-CM | POA: Diagnosis not present

## 2021-01-26 DIAGNOSIS — E039 Hypothyroidism, unspecified: Secondary | ICD-10-CM | POA: Diagnosis not present

## 2021-01-26 DIAGNOSIS — E1122 Type 2 diabetes mellitus with diabetic chronic kidney disease: Secondary | ICD-10-CM | POA: Diagnosis not present

## 2021-01-26 DIAGNOSIS — E538 Deficiency of other specified B group vitamins: Secondary | ICD-10-CM | POA: Diagnosis not present

## 2021-01-26 DIAGNOSIS — Z0000 Encounter for general adult medical examination without abnormal findings: Secondary | ICD-10-CM | POA: Diagnosis not present

## 2021-01-26 DIAGNOSIS — D7589 Other specified diseases of blood and blood-forming organs: Secondary | ICD-10-CM | POA: Diagnosis not present

## 2021-01-26 DIAGNOSIS — E782 Mixed hyperlipidemia: Secondary | ICD-10-CM | POA: Diagnosis not present

## 2021-01-26 DIAGNOSIS — I10 Essential (primary) hypertension: Secondary | ICD-10-CM | POA: Diagnosis not present

## 2021-02-03 DIAGNOSIS — M1711 Unilateral primary osteoarthritis, right knee: Secondary | ICD-10-CM | POA: Diagnosis not present

## 2021-02-03 DIAGNOSIS — M25561 Pain in right knee: Secondary | ICD-10-CM | POA: Diagnosis not present

## 2021-02-03 DIAGNOSIS — M25562 Pain in left knee: Secondary | ICD-10-CM | POA: Diagnosis not present

## 2021-03-17 DIAGNOSIS — M25561 Pain in right knee: Secondary | ICD-10-CM | POA: Diagnosis not present

## 2021-03-24 DIAGNOSIS — T18128A Food in esophagus causing other injury, initial encounter: Secondary | ICD-10-CM | POA: Diagnosis not present

## 2021-03-24 DIAGNOSIS — Z5320 Procedure and treatment not carried out because of patient's decision for unspecified reasons: Secondary | ICD-10-CM | POA: Diagnosis not present

## 2021-06-19 DIAGNOSIS — M1711 Unilateral primary osteoarthritis, right knee: Secondary | ICD-10-CM | POA: Diagnosis not present

## 2021-07-30 DIAGNOSIS — D124 Benign neoplasm of descending colon: Secondary | ICD-10-CM | POA: Diagnosis not present

## 2021-07-30 DIAGNOSIS — D120 Benign neoplasm of cecum: Secondary | ICD-10-CM | POA: Diagnosis not present

## 2021-07-30 DIAGNOSIS — Z86010 Personal history of colonic polyps: Secondary | ICD-10-CM | POA: Diagnosis not present

## 2021-07-30 DIAGNOSIS — K5730 Diverticulosis of large intestine without perforation or abscess without bleeding: Secondary | ICD-10-CM | POA: Diagnosis not present

## 2021-07-30 DIAGNOSIS — D125 Benign neoplasm of sigmoid colon: Secondary | ICD-10-CM | POA: Diagnosis not present

## 2021-07-30 DIAGNOSIS — K648 Other hemorrhoids: Secondary | ICD-10-CM | POA: Diagnosis not present

## 2021-08-04 DIAGNOSIS — D124 Benign neoplasm of descending colon: Secondary | ICD-10-CM | POA: Diagnosis not present

## 2021-08-04 DIAGNOSIS — D120 Benign neoplasm of cecum: Secondary | ICD-10-CM | POA: Diagnosis not present

## 2021-08-04 DIAGNOSIS — D125 Benign neoplasm of sigmoid colon: Secondary | ICD-10-CM | POA: Diagnosis not present

## 2021-09-20 HISTORY — PX: COLONOSCOPY: SHX174

## 2021-10-16 DIAGNOSIS — E119 Type 2 diabetes mellitus without complications: Secondary | ICD-10-CM | POA: Diagnosis not present

## 2021-10-20 DIAGNOSIS — M1711 Unilateral primary osteoarthritis, right knee: Secondary | ICD-10-CM | POA: Diagnosis not present

## 2022-02-09 DIAGNOSIS — R252 Cramp and spasm: Secondary | ICD-10-CM | POA: Diagnosis not present

## 2022-02-09 DIAGNOSIS — E559 Vitamin D deficiency, unspecified: Secondary | ICD-10-CM | POA: Diagnosis not present

## 2022-02-09 DIAGNOSIS — E782 Mixed hyperlipidemia: Secondary | ICD-10-CM | POA: Diagnosis not present

## 2022-02-09 DIAGNOSIS — E1122 Type 2 diabetes mellitus with diabetic chronic kidney disease: Secondary | ICD-10-CM | POA: Diagnosis not present

## 2022-02-09 DIAGNOSIS — Z0000 Encounter for general adult medical examination without abnormal findings: Secondary | ICD-10-CM | POA: Diagnosis not present

## 2022-02-09 DIAGNOSIS — F17210 Nicotine dependence, cigarettes, uncomplicated: Secondary | ICD-10-CM | POA: Diagnosis not present

## 2022-02-09 DIAGNOSIS — E039 Hypothyroidism, unspecified: Secondary | ICD-10-CM | POA: Diagnosis not present

## 2022-02-09 DIAGNOSIS — Z79899 Other long term (current) drug therapy: Secondary | ICD-10-CM | POA: Diagnosis not present

## 2022-02-09 DIAGNOSIS — D692 Other nonthrombocytopenic purpura: Secondary | ICD-10-CM | POA: Diagnosis not present

## 2022-02-09 DIAGNOSIS — Z23 Encounter for immunization: Secondary | ICD-10-CM | POA: Diagnosis not present

## 2022-02-23 ENCOUNTER — Other Ambulatory Visit: Payer: Self-pay | Admitting: Family Medicine

## 2022-02-23 DIAGNOSIS — F17210 Nicotine dependence, cigarettes, uncomplicated: Secondary | ICD-10-CM

## 2022-03-19 ENCOUNTER — Ambulatory Visit
Admission: RE | Admit: 2022-03-19 | Discharge: 2022-03-19 | Disposition: A | Discharge status: Home / Self Care | Payer: Medicare Other | Source: Ambulatory Visit | Attending: Family Medicine | Admitting: Family Medicine

## 2022-03-19 DIAGNOSIS — I700 Atherosclerosis of aorta: Secondary | ICD-10-CM | POA: Diagnosis not present

## 2022-03-19 DIAGNOSIS — Z87891 Personal history of nicotine dependence: Secondary | ICD-10-CM | POA: Diagnosis not present

## 2022-03-19 DIAGNOSIS — I2510 Atherosclerotic heart disease of native coronary artery without angina pectoris: Secondary | ICD-10-CM | POA: Diagnosis not present

## 2022-03-19 DIAGNOSIS — J432 Centrilobular emphysema: Secondary | ICD-10-CM | POA: Diagnosis not present

## 2022-03-19 DIAGNOSIS — F17210 Nicotine dependence, cigarettes, uncomplicated: Secondary | ICD-10-CM

## 2022-03-26 DIAGNOSIS — E039 Hypothyroidism, unspecified: Secondary | ICD-10-CM | POA: Diagnosis not present

## 2022-08-26 DIAGNOSIS — E039 Hypothyroidism, unspecified: Secondary | ICD-10-CM | POA: Diagnosis not present

## 2022-09-23 ENCOUNTER — Other Ambulatory Visit: Payer: Self-pay | Admitting: Family Medicine

## 2022-09-23 DIAGNOSIS — R911 Solitary pulmonary nodule: Secondary | ICD-10-CM

## 2022-09-30 DIAGNOSIS — M5410 Radiculopathy, site unspecified: Secondary | ICD-10-CM | POA: Diagnosis not present

## 2022-10-25 DIAGNOSIS — R319 Hematuria, unspecified: Secondary | ICD-10-CM | POA: Diagnosis not present

## 2022-10-25 DIAGNOSIS — E1122 Type 2 diabetes mellitus with diabetic chronic kidney disease: Secondary | ICD-10-CM | POA: Diagnosis not present

## 2022-10-26 ENCOUNTER — Other Ambulatory Visit: Payer: Self-pay | Admitting: Family Medicine

## 2022-10-26 DIAGNOSIS — R319 Hematuria, unspecified: Secondary | ICD-10-CM

## 2022-10-26 DIAGNOSIS — R310 Gross hematuria: Secondary | ICD-10-CM | POA: Diagnosis not present

## 2022-10-26 DIAGNOSIS — E1122 Type 2 diabetes mellitus with diabetic chronic kidney disease: Secondary | ICD-10-CM | POA: Diagnosis not present

## 2022-10-28 DIAGNOSIS — E119 Type 2 diabetes mellitus without complications: Secondary | ICD-10-CM | POA: Diagnosis not present

## 2022-11-04 ENCOUNTER — Ambulatory Visit
Admission: RE | Admit: 2022-11-04 | Discharge: 2022-11-04 | Disposition: A | Discharge status: Home / Self Care | Payer: Medicare Other | Source: Ambulatory Visit | Attending: Family Medicine | Admitting: Family Medicine

## 2022-11-04 DIAGNOSIS — N200 Calculus of kidney: Secondary | ICD-10-CM | POA: Diagnosis not present

## 2022-11-04 DIAGNOSIS — N3289 Other specified disorders of bladder: Secondary | ICD-10-CM | POA: Diagnosis not present

## 2022-11-04 DIAGNOSIS — K5730 Diverticulosis of large intestine without perforation or abscess without bleeding: Secondary | ICD-10-CM | POA: Diagnosis not present

## 2022-11-04 DIAGNOSIS — R319 Hematuria, unspecified: Secondary | ICD-10-CM

## 2022-11-04 DIAGNOSIS — K6389 Other specified diseases of intestine: Secondary | ICD-10-CM | POA: Diagnosis not present

## 2022-11-04 MED ORDER — IOPAMIDOL (ISOVUE-300) INJECTION 61%
100.0000 mL | Freq: Once | INTRAVENOUS | Status: AC | PRN
  Administered 2022-11-04: 125 mL via INTRAVENOUS

## 2023-03-11 DIAGNOSIS — R319 Hematuria, unspecified: Secondary | ICD-10-CM | POA: Diagnosis not present

## 2023-03-11 DIAGNOSIS — E538 Deficiency of other specified B group vitamins: Secondary | ICD-10-CM | POA: Diagnosis not present

## 2023-03-11 DIAGNOSIS — I700 Atherosclerosis of aorta: Secondary | ICD-10-CM | POA: Diagnosis not present

## 2023-03-11 DIAGNOSIS — E559 Vitamin D deficiency, unspecified: Secondary | ICD-10-CM | POA: Diagnosis not present

## 2023-03-11 DIAGNOSIS — Z79899 Other long term (current) drug therapy: Secondary | ICD-10-CM | POA: Diagnosis not present

## 2023-03-11 DIAGNOSIS — E782 Mixed hyperlipidemia: Secondary | ICD-10-CM | POA: Diagnosis not present

## 2023-03-11 DIAGNOSIS — E039 Hypothyroidism, unspecified: Secondary | ICD-10-CM | POA: Diagnosis not present

## 2023-03-11 DIAGNOSIS — J439 Emphysema, unspecified: Secondary | ICD-10-CM | POA: Diagnosis not present

## 2023-03-11 DIAGNOSIS — N390 Urinary tract infection, site not specified: Secondary | ICD-10-CM | POA: Diagnosis not present

## 2023-03-11 DIAGNOSIS — F17210 Nicotine dependence, cigarettes, uncomplicated: Secondary | ICD-10-CM | POA: Diagnosis not present

## 2023-03-11 DIAGNOSIS — Z0000 Encounter for general adult medical examination without abnormal findings: Secondary | ICD-10-CM | POA: Diagnosis not present

## 2023-03-11 DIAGNOSIS — E1122 Type 2 diabetes mellitus with diabetic chronic kidney disease: Secondary | ICD-10-CM | POA: Diagnosis not present

## 2023-03-16 ENCOUNTER — Other Ambulatory Visit: Payer: Self-pay | Admitting: Family Medicine

## 2023-03-16 DIAGNOSIS — F17210 Nicotine dependence, cigarettes, uncomplicated: Secondary | ICD-10-CM

## 2023-04-11 ENCOUNTER — Ambulatory Visit
Admission: RE | Admit: 2023-04-11 | Discharge: 2023-04-11 | Disposition: A | Discharge status: Home / Self Care | Payer: Medicare Other | Source: Ambulatory Visit | Attending: Family Medicine | Admitting: Family Medicine

## 2023-04-11 DIAGNOSIS — F17210 Nicotine dependence, cigarettes, uncomplicated: Secondary | ICD-10-CM | POA: Diagnosis not present

## 2023-05-04 ENCOUNTER — Emergency Department (HOSPITAL_COMMUNITY): Payer: Medicare Other

## 2023-05-04 ENCOUNTER — Emergency Department (HOSPITAL_COMMUNITY)
Admission: EM | Admit: 2023-05-04 | Discharge: 2023-05-04 | Disposition: A | Discharge status: Home / Self Care | Payer: Medicare Other | Attending: Emergency Medicine | Admitting: Emergency Medicine

## 2023-05-04 ENCOUNTER — Other Ambulatory Visit: Payer: Self-pay

## 2023-05-04 ENCOUNTER — Encounter (HOSPITAL_COMMUNITY): Payer: Self-pay

## 2023-05-04 DIAGNOSIS — S0083XA Contusion of other part of head, initial encounter: Secondary | ICD-10-CM | POA: Diagnosis not present

## 2023-05-04 DIAGNOSIS — W010XXA Fall on same level from slipping, tripping and stumbling without subsequent striking against object, initial encounter: Secondary | ICD-10-CM | POA: Insufficient documentation

## 2023-05-04 DIAGNOSIS — S60511A Abrasion of right hand, initial encounter: Secondary | ICD-10-CM | POA: Diagnosis not present

## 2023-05-04 DIAGNOSIS — W19XXXA Unspecified fall, initial encounter: Secondary | ICD-10-CM | POA: Diagnosis not present

## 2023-05-04 DIAGNOSIS — M25559 Pain in unspecified hip: Secondary | ICD-10-CM | POA: Diagnosis not present

## 2023-05-04 DIAGNOSIS — S0181XA Laceration without foreign body of other part of head, initial encounter: Secondary | ICD-10-CM | POA: Diagnosis not present

## 2023-05-04 DIAGNOSIS — S6992XA Unspecified injury of left wrist, hand and finger(s), initial encounter: Secondary | ICD-10-CM | POA: Insufficient documentation

## 2023-05-04 DIAGNOSIS — S62300A Unspecified fracture of second metacarpal bone, right hand, initial encounter for closed fracture: Secondary | ICD-10-CM | POA: Diagnosis not present

## 2023-05-04 DIAGNOSIS — Z7982 Long term (current) use of aspirin: Secondary | ICD-10-CM | POA: Insufficient documentation

## 2023-05-04 DIAGNOSIS — E119 Type 2 diabetes mellitus without complications: Secondary | ICD-10-CM | POA: Insufficient documentation

## 2023-05-04 DIAGNOSIS — S0993XA Unspecified injury of face, initial encounter: Secondary | ICD-10-CM | POA: Diagnosis not present

## 2023-05-04 DIAGNOSIS — M8580 Other specified disorders of bone density and structure, unspecified site: Secondary | ICD-10-CM | POA: Diagnosis not present

## 2023-05-04 DIAGNOSIS — Z7984 Long term (current) use of oral hypoglycemic drugs: Secondary | ICD-10-CM | POA: Insufficient documentation

## 2023-05-04 DIAGNOSIS — R58 Hemorrhage, not elsewhere classified: Secondary | ICD-10-CM | POA: Diagnosis not present

## 2023-05-04 DIAGNOSIS — R9431 Abnormal electrocardiogram [ECG] [EKG]: Secondary | ICD-10-CM | POA: Diagnosis not present

## 2023-05-04 DIAGNOSIS — R918 Other nonspecific abnormal finding of lung field: Secondary | ICD-10-CM | POA: Diagnosis not present

## 2023-05-04 DIAGNOSIS — S6991XA Unspecified injury of right wrist, hand and finger(s), initial encounter: Secondary | ICD-10-CM | POA: Insufficient documentation

## 2023-05-04 DIAGNOSIS — Z79899 Other long term (current) drug therapy: Secondary | ICD-10-CM | POA: Insufficient documentation

## 2023-05-04 DIAGNOSIS — R079 Chest pain, unspecified: Secondary | ICD-10-CM | POA: Diagnosis not present

## 2023-05-04 DIAGNOSIS — Y93K1 Activity, walking an animal: Secondary | ICD-10-CM | POA: Diagnosis not present

## 2023-05-04 DIAGNOSIS — I700 Atherosclerosis of aorta: Secondary | ICD-10-CM | POA: Diagnosis not present

## 2023-05-04 DIAGNOSIS — R9082 White matter disease, unspecified: Secondary | ICD-10-CM | POA: Diagnosis not present

## 2023-05-04 DIAGNOSIS — S0990XA Unspecified injury of head, initial encounter: Secondary | ICD-10-CM | POA: Diagnosis not present

## 2023-05-04 DIAGNOSIS — Z23 Encounter for immunization: Secondary | ICD-10-CM | POA: Insufficient documentation

## 2023-05-04 DIAGNOSIS — S0081XA Abrasion of other part of head, initial encounter: Secondary | ICD-10-CM | POA: Insufficient documentation

## 2023-05-04 DIAGNOSIS — Z043 Encounter for examination and observation following other accident: Secondary | ICD-10-CM | POA: Diagnosis not present

## 2023-05-04 DIAGNOSIS — T1490XA Injury, unspecified, initial encounter: Secondary | ICD-10-CM

## 2023-05-04 DIAGNOSIS — M25531 Pain in right wrist: Secondary | ICD-10-CM | POA: Diagnosis not present

## 2023-05-04 DIAGNOSIS — S199XXA Unspecified injury of neck, initial encounter: Secondary | ICD-10-CM | POA: Diagnosis not present

## 2023-05-04 DIAGNOSIS — M1811 Unilateral primary osteoarthritis of first carpometacarpal joint, right hand: Secondary | ICD-10-CM | POA: Diagnosis not present

## 2023-05-04 DIAGNOSIS — R410 Disorientation, unspecified: Secondary | ICD-10-CM | POA: Diagnosis not present

## 2023-05-04 DIAGNOSIS — M1812 Unilateral primary osteoarthritis of first carpometacarpal joint, left hand: Secondary | ICD-10-CM | POA: Diagnosis not present

## 2023-05-04 LAB — COMPREHENSIVE METABOLIC PANEL
ALT: 17 U/L (ref 0–44)
AST: 18 U/L (ref 15–41)
Albumin: 3.7 g/dL (ref 3.5–5.0)
Alkaline Phosphatase: 38 U/L (ref 38–126)
Anion gap: 15 (ref 5–15)
BUN: 11 mg/dL (ref 8–23)
CO2: 17 mmol/L — ABNORMAL LOW (ref 22–32)
Calcium: 8.5 mg/dL — ABNORMAL LOW (ref 8.9–10.3)
Chloride: 104 mmol/L (ref 98–111)
Creatinine, Ser: 1.06 mg/dL (ref 0.61–1.24)
GFR, Estimated: >60 mL/min (ref >60)
Glucose, Bld: 149 mg/dL — ABNORMAL HIGH (ref 70–99)
Potassium: 3.5 mmol/L (ref 3.5–5.1)
Sodium: 136 mmol/L (ref 135–145)
Total Bilirubin: 0.7 mg/dL (ref 0.3–1.2)
Total Protein: 6.2 g/dL — ABNORMAL LOW (ref 6.5–8.1)

## 2023-05-04 LAB — CBC WITH DIFFERENTIAL/PLATELET
Abs Immature Granulocytes: 0.02 10*3/uL (ref 0.00–0.07)
Basophils Absolute: 0.1 10*3/uL (ref 0.0–0.1)
Basophils Relative: 1 %
Eosinophils Absolute: 0.3 10*3/uL (ref 0.0–0.5)
Eosinophils Relative: 4 %
HCT: 48.2 % (ref 39.0–52.0)
Hemoglobin: 16.3 g/dL (ref 13.0–17.0)
Immature Granulocytes: 0 %
Lymphocytes Relative: 42 %
Lymphs Abs: 3.5 10*3/uL (ref 0.7–4.0)
MCH: 34.3 pg — ABNORMAL HIGH (ref 26.0–34.0)
MCHC: 33.8 g/dL (ref 30.0–36.0)
MCV: 101.5 fL — ABNORMAL HIGH (ref 80.0–100.0)
Monocytes Absolute: 0.4 10*3/uL (ref 0.1–1.0)
Monocytes Relative: 4 %
Neutro Abs: 4.2 10*3/uL (ref 1.7–7.7)
Neutrophils Relative %: 49 %
Platelets: 178 10*3/uL (ref 150–400)
RBC: 4.75 MIL/uL (ref 4.22–5.81)
RDW: 13.4 % (ref 11.5–15.5)
WBC: 8.5 10*3/uL (ref 4.0–10.5)
nRBC: 0 % (ref 0.0–0.2)

## 2023-05-04 LAB — ETHANOL: Alcohol, Ethyl (B): 123 mg/dL — ABNORMAL HIGH (ref <10)

## 2023-05-04 MED ORDER — TETANUS-DIPHTH-ACELL PERTUSSIS 5-2.5-18.5 LF-MCG/0.5 IM SUSY
0.5000 mL | PREFILLED_SYRINGE | Freq: Once | INTRAMUSCULAR | Status: AC
  Administered 2023-05-04: 0.5 mL via INTRAMUSCULAR
  Filled 2023-05-04: qty 0.5

## 2023-05-04 NOTE — Discharge Instructions (Signed)
Keep the splint on your wrist.  Apply antibiotic ointment to the abrasions.  Follow-up with your orthopedic doctor for further evaluation

## 2023-05-04 NOTE — ED Triage Notes (Signed)
Pt BIB EMS as a level 2 fall. Pt was walking dog, tripped and dog drug pt on cement. Pt arrives in c-collar. Pt has facial trauma, abrasions, hematomas, and lacerations all over face and lacs to upper chest. C/O bilateral wrist pain. Denies blood thinners and denies LOC. Pt admits to having 4 glasses of bourbon. Pt received 500CC of NS

## 2023-05-04 NOTE — Progress Notes (Signed)
Orthopedic Tech Progress Note Patient Details:  Leonard Walton 1951-03-08 [id number]  Ortho Devices Type of Ortho Device: Volar splint Ortho Device/Splint Location: RUE Ortho Device/Splint Interventions: Ordered, Application, Adjustment   Post Interventions Patient Tolerated: Well Instructions Provided: Care of device, Adjustment of device  [place] A [name] 05/04/2023, 8:01 PM

## 2023-05-04 NOTE — ED Notes (Signed)
Called ortho tech for splint 

## 2023-05-04 NOTE — ED Provider Notes (Signed)
[place] EMERGENCY DEPARTMENT AT [facility] Provider Note   CSN: [id number] Arrival date & time: 05/04/23  1642     History {Add pertinent medical, surgical, social history, OB history to HPI:1} Chief Complaint  Patient presents with   Leonard Walton is a 72 y.o. male.   Fall   Patient has a history of diabetes hyperlipidemia peptic ulcer disease BPH.  Patient states he had a few bourbon and Cokes today.  He went to go walk his dog following that.  Patient ended up tripping on the cement and was pulled along the ground by his dog.  Patient sustained abrasions hematomas to his face.  Patient does complain of mild headache.  He denied any loss of consciousness.  Per EMS he was having some repetitive questioning.  Patient denies any neck or back pain.  He does complain of some pain in his wrists.    Home Medications Prior to Admission medications   Medication Sig Start Date End Date Taking? Authorizing Provider  aspirin EC 81 MG tablet Take 81 mg by mouth daily.    [provider]  b complex vitamins capsule Take 1 capsule by mouth daily.    [provider]  DULoxetine (CYMBALTA) 60 MG capsule TAKE 1 CAPSULE BY MOUTH EVERY DAY 02/29/20   Levert Feinstein, MD  levothyroxine (SYNTHROID, LEVOTHROID) 75 MCG tablet Take 75 mcg by mouth daily. 11/11/14   [provider]  metFORMIN (GLUCOPHAGE) 500 MG tablet Take 500 mg by mouth 2 (two) times daily with a meal.  10/31/14   [provider]  rosuvastatin (CRESTOR) 5 MG tablet Take 5 mg by mouth daily. 12/27/19   [provider]  tamsulosin (FLOMAX) 0.4 MG CAPS capsule Take 0.4 mg by mouth daily.    [provider]  VITAMIN D PO Take 2,000 Units by mouth daily.    [provider]      Allergies    Patient has no known allergies.    Review of Systems   Review of Systems  Physical Exam Updated Vital Signs BP 130/70   Temp 97.9 F (36.6 C) (Oral)   Ht 1.702 m (5'  7")   Wt 88 kg   BMI 30.38 kg/m  Physical Exam Vitals and nursing note reviewed.  Constitutional:      Appearance: He is well-developed. He is not diaphoretic.  HENT:     Head: Normocephalic.     Comments: Abrasions and superficial lacerations noted on the forehead and face    Right Ear: External ear normal.     Left Ear: External ear normal.  Eyes:     General: No scleral icterus.       Right eye: No discharge.        Left eye: No discharge.     Conjunctiva/sclera: Conjunctivae normal.  Neck:     Trachea: No tracheal deviation.  Cardiovascular:     Rate and Rhythm: Normal rate and regular rhythm.  Pulmonary:     [place]ffort: Pulmonary effort is normal. No respiratory distress.     Breath sounds: Normal breath sounds. No stridor. No wheezing or rales.  Abdominal:     General: Bowel sounds are normal. There is no distension.     Palpations: Abdomen is soft.     Tenderness: There is no abdominal tenderness. There is no guarding or rebound.  Musculoskeletal:        General: No tenderness or deformity.  Cervical back: Neck supple.     Comments: No tenderness palpation thoracic or lumbar spine, no tenderness to palpation bilateral hips knees or ankles, mild tenderness palpation bilateral wrists  Skin:    General: Skin is warm and dry.     Findings: No rash.  Neurological:     General: No focal deficit present.     Mental Status: He is alert.     Cranial Nerves: No cranial nerve deficit, dysarthria or facial asymmetry.     Sensory: No sensory deficit.     Motor: No abnormal muscle tone or seizure activity.     Coordination: Coordination normal.  Psychiatric:        Mood and Affect: Mood normal.     ED Results / Procedures / Treatments   Labs (all labs ordered are listed, but only abnormal results are displayed) Labs Reviewed - No data to display  EKG None  Radiology No results found.  Procedures Procedures  {Document cardiac monitor, telemetry assessment procedure  when appropriate:1}  Medications Ordered in ED Medications  Tdap (BOOSTRIX) injection 0.5 mL (has no administration in time range)    ED Course/ Medical Decision Making/ A&P   {   Click here for ABCD2, HEART and other calculatorsREFRESH Note before signing :1}                              Medical Decision Making Amount and/or Complexity of Data Reviewed Labs: ordered. Radiology: ordered.  Risk Prescription drug management.   ***  {Document critical care time when appropriate:1} {Document review of labs and clinical decision tools ie heart score, Chads2Vasc2 etc:1}  {Document your independent review of radiology images, and any outside records:1} {Document your discussion with family members, caretakers, and with consultants:1} {Document social determinants of health affecting pt's care:1} {Document your decision making why or why not admission, treatments were needed:1} Final Clinical Impression(s) / ED Diagnoses Final diagnoses:  None    Rx / DC Orders ED Discharge Orders     None

## 2023-05-04 NOTE — ED Notes (Signed)
Patient transported to CT 

## 2023-05-09 DIAGNOSIS — S60211A Contusion of right wrist, initial encounter: Secondary | ICD-10-CM | POA: Diagnosis not present

## 2023-05-13 ENCOUNTER — Ambulatory Visit: Payer: Medicare Other | Admitting: Emergency Medicine

## 2023-05-13 ENCOUNTER — Encounter: Payer: Self-pay | Admitting: Emergency Medicine

## 2023-05-13 VITALS — BP 128/70 | HR 73 | Ht 67.0 in | Wt 202.4 lb

## 2023-05-13 DIAGNOSIS — J449 Chronic obstructive pulmonary disease, unspecified: Secondary | ICD-10-CM | POA: Diagnosis not present

## 2023-05-13 DIAGNOSIS — R911 Solitary pulmonary nodule: Secondary | ICD-10-CM

## 2023-05-13 MED ORDER — BREZTRI AEROSPHERE 160-9-4.8 MCG/ACT IN AERO: 2.0000 | INHALATION_SPRAY | Freq: Two times a day (BID) | RESPIRATORY_TRACT | 11 refills | Status: DC

## 2023-05-13 NOTE — Progress Notes (Signed)
Subjective:    Patient ID: Leonard Walton, male    DOB: 1951/01/24, 72 y.o.   MRN: [id number]  HPI Leonard Walton is a 72, active smoker (50+ pack years) with a history of hypothyroidism, diabetes, GERD with PUD, depression.  He is referred today for evaluation of pulmonary nodule noted on lung cancer screening CT. He does not have a formal dx COPD, ut he does have exertional SOB w stairs, etc. He has been tried on [place], believes that he has benefited - less wheeze, better exertional tolerance. Rare albuterol. Used to have bronchitis about once a year.   Lung cancer screening CT chest 04/11/2023 reviewed by me shows some centrilobular and paraseptal emphysema, no mediastinal adenopathy, a new 10 mm irregular spiculated nodule in the posterior right lower lobe (versus superior accessory fissure, anatomical variant). LLL nodule appears to be stable    Review of Systems As per HPI  Past Medical History:  Diagnosis Date   BPH (benign prostatic hyperplasia)    Depression    Diabetes mellitus (HCC)    Hyperlipidemia    Hypothyroid    Low back pain    Low serum vitamin B12    Numbness    PUD (peptic ulcer disease)      Family History  Problem Relation Age of Onset   CAD Mother        MI at age 59   Heart attack Mother    Lung cancer Father    Throat cancer Sister    Prostate cancer Brother    Ovarian cancer Sister      Social History   Socioeconomic History   Marital status: Divorced    Spouse name: Not on file   Number of children: 3   Years of education: college   Highest education level: Associate degree: academic program  Occupational History   Occupation: retired from [employer]  Tobacco Use   Smoking status: Every Day    Current packs/day: 0.50    Types: E-cigarettes, Cigarettes   Smokeless tobacco: Never  Substance and Sexual Activity   Alcohol use: Yes    Comment: 2-3 drinks per day   Drug use: No   Sexual activity: Not on file  Other Topics Concern   Not on file   Social History Narrative   Lives alone.   Right-handed.   Occasional soda.   Social Determinants of Health   Financial Resource Strain: Not on file  Food Insecurity: Not on file  Transportation Needs: Not on file  Physical Activity: Not on file  Stress: Not on file  Social Connections: Not on file  Intimate Partner Violence: Not on file     No Known Allergies   Outpatient Medications Prior to Visit  Medication Sig Dispense Refill   aspirin EC 81 MG tablet Take 81 mg by mouth daily.     b complex vitamins capsule Take 1 capsule by mouth daily.     DULoxetine (CYMBALTA) 60 MG capsule TAKE 1 CAPSULE BY MOUTH EVERY DAY 90 capsule 3   levothyroxine (SYNTHROID, LEVOTHROID) 75 MCG tablet Take 75 mcg by mouth daily.  0   metFORMIN (GLUCOPHAGE) 500 MG tablet Take 500 mg by mouth 2 (two) times daily with a meal.   5   rosuvastatin (CRESTOR) 5 MG tablet Take 5 mg by mouth daily.     tamsulosin (FLOMAX) 0.4 MG CAPS capsule Take 0.4 mg by mouth daily.     VITAMIN D PO Take 2,000 Units by mouth daily.  No facility-administered medications prior to visit.        Objective:   Physical Exam Vitals:   05/13/23 0941  BP: 128/70  Pulse: 73  SpO2: 96%  Weight: 202 lb 6.4 oz (91.8 kg)  Height: 5\' 7"  (1.702 m)   Gen: Pleasant, well-nourished, in no distress,  normal affect  ENT: No lesions,  mouth clear,  oropharynx clear, no postnasal drip  Neck: No JVD, no stridor  Lungs: No use of accessory muscles, no crackles or wheezing on normal respiration, no wheeze on forced expiration  Cardiovascular: RRR, heart sounds normal, no murmur or gallops, no peripheral edema  Musculoskeletal: No deformities, no cyanosis or clubbing  Neuro: alert, awake, non focal  Skin: Warm, no lesions or rash      Assessment & Plan:  Pulmonary nodule We reviewed your CT scans of the chest today.  There is a new right lower lobe pulmonary nodule present. We will arrange for navigational  bronchoscopy.  This will be done under general anesthesia as an outpatient at [facility] endoscopy.  You will need a designated driver and someone to watch you at home on that day after the procedure.  We will stop your aspirin 2 days prior.  We will try to get this scheduled for 06/06/2023. We will arrange for a repeat CT scan of your chest prior to that bronchoscopy. Follow with T Parrett in about 1 month Follow Dr. Delton Coombes in 3 months, call sooner if you have problems.  COPD (chronic obstructive pulmonary disease) (HCC) Please continue Breztri 2 puffs twice a day.  Rinse and gargle after using. Keep your albuterol available to use 2 puffs if needed for shortness of breath, chest tightness, wheezing. We will arrange for pulmonary function testing   Levy Pupa, MD, PhD 05/16/2023, 8:59 AM [facility] [phone] or if no answer before 7:00PM call [phone] For any issues after 7:00PM please call eLink [phone]

## 2023-05-13 NOTE — Patient Instructions (Addendum)
We reviewed your CT scans of the chest today.  There is a new right lower lobe pulmonary nodule present. We will arrange for navigational bronchoscopy.  This will be done under general anesthesia as an outpatient at [facility] endoscopy.  You will need a designated driver and someone to watch you at home on that day after the procedure.  We will stop your aspirin 2 days prior.  We will try to get this scheduled for 06/06/2023. We will arrange for a repeat CT scan of your chest prior to that bronchoscopy. Please continue Breztri 2 puffs twice a day.  Rinse and gargle after using. Keep your albuterol available to use 2 puffs if needed for shortness of breath, chest tightness, wheezing. We will arrange for pulmonary function testing Follow with T Parrett in about 1 month Follow Dr. Delton Coombes in 3 months, call sooner if you have problems.

## 2023-05-13 NOTE — H&P (View-Only) (Signed)
Subjective:    Patient ID: Leonard Walton, male    DOB: 10-15-1950, 72 y.o.   MRN: [id number]  HPI Mr. Geissler is a 6, active smoker (50+ pack years) with a history of hypothyroidism, diabetes, GERD with PUD, depression.  He is referred today for evaluation of pulmonary nodule noted on lung cancer screening CT. He does not have a formal dx COPD, ut he does have exertional SOB w stairs, etc. He has been tried on [place], believes that he has benefited - less wheeze, better exertional tolerance. Rare albuterol. Used to have bronchitis about once a year.   Lung cancer screening CT chest 04/11/2023 reviewed by me shows some centrilobular and paraseptal emphysema, no mediastinal adenopathy, a new 10 mm irregular spiculated nodule in the posterior right lower lobe (versus superior accessory fissure, anatomical variant). LLL nodule appears to be stable    Review of Systems As per HPI  Past Medical History:  Diagnosis Date   BPH (benign prostatic hyperplasia)    Depression    Diabetes mellitus (HCC)    Hyperlipidemia    Hypothyroid    Low back pain    Low serum vitamin B12    Numbness    PUD (peptic ulcer disease)      Family History  Problem Relation Age of Onset   CAD Mother        MI at age 52   Heart attack Mother    Lung cancer Father    Throat cancer Sister    Prostate cancer Brother    Ovarian cancer Sister      Social History   Socioeconomic History   Marital status: Divorced    Spouse name: Not on file   Number of children: 3   Years of education: college   Highest education level: Associate degree: academic program  Occupational History   Occupation: retired from [employer]  Tobacco Use   Smoking status: Every Day    Current packs/day: 0.50    Types: E-cigarettes, Cigarettes   Smokeless tobacco: Never  Substance and Sexual Activity   Alcohol use: Yes    Comment: 2-3 drinks per day   Drug use: No   Sexual activity: Not on file  Other Topics Concern   Not on file   Social History Narrative   Lives alone.   Right-handed.   Occasional soda.   Social Determinants of Health   Financial Resource Strain: Not on file  Food Insecurity: Not on file  Transportation Needs: Not on file  Physical Activity: Not on file  Stress: Not on file  Social Connections: Not on file  Intimate Partner Violence: Not on file     No Known Allergies   Outpatient Medications Prior to Visit  Medication Sig Dispense Refill   aspirin EC 81 MG tablet Take 81 mg by mouth daily.     b complex vitamins capsule Take 1 capsule by mouth daily.     DULoxetine (CYMBALTA) 60 MG capsule TAKE 1 CAPSULE BY MOUTH EVERY DAY 90 capsule 3   levothyroxine (SYNTHROID, LEVOTHROID) 75 MCG tablet Take 75 mcg by mouth daily.  0   metFORMIN (GLUCOPHAGE) 500 MG tablet Take 500 mg by mouth 2 (two) times daily with a meal.   5   rosuvastatin (CRESTOR) 5 MG tablet Take 5 mg by mouth daily.     tamsulosin (FLOMAX) 0.4 MG CAPS capsule Take 0.4 mg by mouth daily.     VITAMIN D PO Take 2,000 Units by mouth daily.  No facility-administered medications prior to visit.        Objective:   Physical Exam Vitals:   05/13/23 0941  BP: 128/70  Pulse: 73  SpO2: 96%  Weight: 202 lb 6.4 oz (91.8 kg)  Height: 5\' 7"  (1.702 m)   Gen: Pleasant, well-nourished, in no distress,  normal affect  ENT: No lesions,  mouth clear,  oropharynx clear, no postnasal drip  Neck: No JVD, no stridor  Lungs: No use of accessory muscles, no crackles or wheezing on normal respiration, no wheeze on forced expiration  Cardiovascular: RRR, heart sounds normal, no murmur or gallops, no peripheral edema  Musculoskeletal: No deformities, no cyanosis or clubbing  Neuro: alert, awake, non focal  Skin: Warm, no lesions or rash      Assessment & Plan:  Pulmonary nodule We reviewed your CT scans of the chest today.  There is a new right lower lobe pulmonary nodule present. We will arrange for navigational  bronchoscopy.  This will be done under general anesthesia as an outpatient at [facility] endoscopy.  You will need a designated driver and someone to watch you at home on that day after the procedure.  We will stop your aspirin 2 days prior.  We will try to get this scheduled for 06/06/2023. We will arrange for a repeat CT scan of your chest prior to that bronchoscopy. Follow with T Parrett in about 1 month Follow Dr. Delton Coombes in 3 months, call sooner if you have problems.  COPD (chronic obstructive pulmonary disease) (HCC) Please continue Breztri 2 puffs twice a day.  Rinse and gargle after using. Keep your albuterol available to use 2 puffs if needed for shortness of breath, chest tightness, wheezing. We will arrange for pulmonary function testing   Levy Pupa, MD, PhD 05/16/2023, 8:59 AM [facility] [phone] or if no answer before 7:00PM call [phone] For any issues after 7:00PM please call eLink [phone]

## 2023-05-16 DIAGNOSIS — J449 Chronic obstructive pulmonary disease, unspecified: Secondary | ICD-10-CM | POA: Insufficient documentation

## 2023-05-16 DIAGNOSIS — R911 Solitary pulmonary nodule: Secondary | ICD-10-CM | POA: Insufficient documentation

## 2023-05-16 NOTE — Assessment & Plan Note (Signed)
We reviewed your CT scans of the chest today.  There is a new right lower lobe pulmonary nodule present. We will arrange for navigational bronchoscopy.  This will be done under general anesthesia as an outpatient at [facility] endoscopy.  You will need a designated driver and someone to watch you at home on that day after the procedure.  We will stop your aspirin 2 days prior.  We will try to get this scheduled for 06/06/2023. We will arrange for a repeat CT scan of your chest prior to that bronchoscopy. Follow with T Parrett in about 1 month Follow Dr. Delton Coombes in 3 months, call sooner if you have problems.

## 2023-05-16 NOTE — Assessment & Plan Note (Signed)
Please continue Breztri 2 puffs twice a day.  Rinse and gargle after using. Keep your albuterol available to use 2 puffs if needed for shortness of breath, chest tightness, wheezing. We will arrange for pulmonary function testing

## 2023-05-22 DIAGNOSIS — C3431 Malignant neoplasm of lower lobe, right bronchus or lung: Secondary | ICD-10-CM

## 2023-05-22 HISTORY — DX: Malignant neoplasm of lower lobe, right bronchus or lung: C34.31

## 2023-05-30 ENCOUNTER — Ambulatory Visit: Payer: Medicare Other | Admitting: Emergency Medicine

## 2023-05-30 DIAGNOSIS — R911 Solitary pulmonary nodule: Secondary | ICD-10-CM

## 2023-05-30 LAB — PULMONARY FUNCTION TEST
DL/VA % pred: 89 %
DL/VA: 3.61 ml/min/mmHg/L
DLCO cor % pred: 81 %
DLCO cor: 18.82 ml/min/mmHg
DLCO unc % pred: 84 %
DLCO unc: 19.67 ml/min/mmHg
FEF 25-75 Post: 0.62 L/s
FEF 25-75 Pre: 1.44 L/s
FEF2575-%Change-Post: -57 %
FEF2575-%Pred-Post: 30 %
FEF2575-%Pred-Pre: 70 %
FEV1-%Change-Post: -49 %
FEV1-%Pred-Post: 38 %
FEV1-%Pred-Pre: 75 %
FEV1-Post: 1.05 L
FEV1-Pre: 2.09 L
FEV1FVC-%Change-Post: -53 %
FEV1FVC-%Pred-Pre: 93 %
FEV6-%Change-Post: 7 %
FEV6-%Pred-Post: 88 %
FEV6-%Pred-Pre: 82 %
FEV6-Post: 3.16 L
FEV6-Pre: 2.93 L
FEV6FVC-%Change-Post: -3 %
FEV6FVC-%Pred-Post: 102 %
FEV6FVC-%Pred-Pre: 106 %
FVC-%Change-Post: 7 %
FVC-%Pred-Post: 85 %
FVC-%Pred-Pre: 79 %
FVC-Post: 3.28 L
FVC-Pre: 3.04 L
Post FEV1/FVC ratio: 32 %
Post FEV6/FVC ratio: 96 %
Pre FEV1/FVC ratio: 69 %
Pre FEV6/FVC Ratio: 99 %
RV % pred: 98 %
RV: 2.31 L
TLC % pred: 91 %
TLC: 5.88 L

## 2023-05-30 NOTE — Patient Instructions (Signed)
Full PFT performed today. °

## 2023-05-30 NOTE — Progress Notes (Signed)
Full PFT performed today. °

## 2023-06-02 ENCOUNTER — Ambulatory Visit
Admission: RE | Admit: 2023-06-02 | Discharge: 2023-06-02 | Disposition: A | Discharge status: Home / Self Care | Payer: Medicare Other | Source: Ambulatory Visit | Attending: Emergency Medicine | Admitting: Emergency Medicine

## 2023-06-02 DIAGNOSIS — R911 Solitary pulmonary nodule: Secondary | ICD-10-CM

## 2023-06-02 DIAGNOSIS — I700 Atherosclerosis of aorta: Secondary | ICD-10-CM | POA: Diagnosis not present

## 2023-06-02 DIAGNOSIS — R918 Other nonspecific abnormal finding of lung field: Secondary | ICD-10-CM | POA: Diagnosis not present

## 2023-06-03 ENCOUNTER — Other Ambulatory Visit: Payer: Self-pay

## 2023-06-03 ENCOUNTER — Encounter (HOSPITAL_COMMUNITY): Payer: Self-pay | Admitting: Emergency Medicine

## 2023-06-03 NOTE — Progress Notes (Signed)
PCP - Dr Mila Palmer Cardiologist - none  Chest x-ray - 05/04/23 (1V) EKG - 05/04/23 Stress Test - pt states neg stress test yrs ago ECHO - n/a Cardiac Cath - n/a  ICD Pacemaker/Loop - n/a  Sleep Study -  n/a CPAP - none  Diabetes Type 2 Do not take Metformin on the morning of surgery.  If your blood sugar is less than 70 mg/dL, you will need to treat for low blood sugar: Treat a low blood sugar (less than 70 mg/dL) with  cup of clear juice (cranberry or apple), 4 glucose tablets, OR glucose gel. Recheck blood sugar in 15 minutes after treatment (to make sure it is greater than 70 mg/dL). If your blood sugar is not greater than 70 mg/dL on recheck, call [phone] for further instructions.  Aspirin Instructions: Last dose was on 06/03/23.  NPO  STOP now taking any Aspirin (unless otherwise instructed by your surgeon), Aleve, Naproxen, Ibuprofen, Motrin, Advil, Goody's, BC's, all herbal medications, fish oil, and all vitamins.   Coronavirus Screening Do you have any of the following symptoms:  Cough Yes, occasional Fever (>100.90F)  yes/no: No Runny nose yes/no: No Sore throat yes/no: No Difficulty breathing/shortness of breath  yes/no: No  Have you traveled in the last 14 days and where? yes/no: No  Patient verbalized understanding of instructions that were given via phone.

## 2023-06-06 ENCOUNTER — Ambulatory Visit (HOSPITAL_COMMUNITY): Payer: Medicare Other

## 2023-06-06 ENCOUNTER — Ambulatory Visit (HOSPITAL_COMMUNITY): Payer: Medicare Other | Admitting: Anesthesiology

## 2023-06-06 ENCOUNTER — Encounter (HOSPITAL_COMMUNITY): Payer: Self-pay | Admitting: Emergency Medicine

## 2023-06-06 ENCOUNTER — Encounter (HOSPITAL_COMMUNITY)
Admission: RE | Disposition: A | Discharge status: Home / Self Care | Payer: Self-pay | Source: Ambulatory Visit | Attending: Emergency Medicine

## 2023-06-06 ENCOUNTER — Ambulatory Visit (HOSPITAL_COMMUNITY)
Admission: RE | Admit: 2023-06-06 | Discharge: 2023-06-06 | Disposition: A | Discharge status: Home / Self Care | Payer: Medicare Other | Source: Ambulatory Visit | Attending: Emergency Medicine | Admitting: Emergency Medicine

## 2023-06-06 DIAGNOSIS — F17210 Nicotine dependence, cigarettes, uncomplicated: Secondary | ICD-10-CM | POA: Insufficient documentation

## 2023-06-06 DIAGNOSIS — R918 Other nonspecific abnormal finding of lung field: Secondary | ICD-10-CM | POA: Diagnosis present

## 2023-06-06 DIAGNOSIS — F32A Depression, unspecified: Secondary | ICD-10-CM | POA: Insufficient documentation

## 2023-06-06 DIAGNOSIS — J449 Chronic obstructive pulmonary disease, unspecified: Secondary | ICD-10-CM | POA: Insufficient documentation

## 2023-06-06 DIAGNOSIS — E039 Hypothyroidism, unspecified: Secondary | ICD-10-CM | POA: Diagnosis not present

## 2023-06-06 DIAGNOSIS — C3431 Malignant neoplasm of lower lobe, right bronchus or lung: Secondary | ICD-10-CM | POA: Insufficient documentation

## 2023-06-06 DIAGNOSIS — Z7984 Long term (current) use of oral hypoglycemic drugs: Secondary | ICD-10-CM | POA: Insufficient documentation

## 2023-06-06 DIAGNOSIS — J432 Centrilobular emphysema: Secondary | ICD-10-CM | POA: Insufficient documentation

## 2023-06-06 DIAGNOSIS — Z8711 Personal history of peptic ulcer disease: Secondary | ICD-10-CM | POA: Insufficient documentation

## 2023-06-06 DIAGNOSIS — E119 Type 2 diabetes mellitus without complications: Secondary | ICD-10-CM | POA: Diagnosis not present

## 2023-06-06 DIAGNOSIS — F17290 Nicotine dependence, other tobacco product, uncomplicated: Secondary | ICD-10-CM | POA: Insufficient documentation

## 2023-06-06 DIAGNOSIS — R911 Solitary pulmonary nodule: Secondary | ICD-10-CM | POA: Diagnosis not present

## 2023-06-06 DIAGNOSIS — Z48813 Encounter for surgical aftercare following surgery on the respiratory system: Secondary | ICD-10-CM | POA: Diagnosis not present

## 2023-06-06 HISTORY — PX: BRONCHIAL BRUSHINGS: SHX5108

## 2023-06-06 HISTORY — DX: Pneumonia, unspecified organism: J18.9

## 2023-06-06 HISTORY — PX: FIDUCIAL MARKER PLACEMENT: SHX6858

## 2023-06-06 HISTORY — PX: BRONCHIAL NEEDLE ASPIRATION BIOPSY: SHX5106

## 2023-06-06 HISTORY — PX: BRONCHIAL BIOPSY: SHX5109

## 2023-06-06 HISTORY — DX: Unspecified osteoarthritis, unspecified site: M19.90

## 2023-06-06 LAB — GLUCOSE, CAPILLARY
Glucose-Capillary: 159 mg/dL — ABNORMAL HIGH (ref 70–99)
Glucose-Capillary: 123 mg/dL — ABNORMAL HIGH (ref 70–99)

## 2023-06-06 SURGERY — BRONCHOSCOPY, WITH BIOPSY USING ELECTROMAGNETIC NAVIGATION
Anesthesia: General

## 2023-06-06 MED ORDER — INSULIN ASPART 100 UNIT/ML IJ SOLN
0.0000 [IU] | INTRAMUSCULAR | Status: DC | PRN
  Filled 2023-06-06: qty 0.07

## 2023-06-06 MED ORDER — SUGAMMADEX SODIUM 200 MG/2ML IV SOLN
INTRAVENOUS | Status: DC | PRN
  Administered 2023-06-06: 200 mg via INTRAVENOUS

## 2023-06-06 MED ORDER — OXYCODONE HCL 5 MG PO TABS: 5.0000 mg | ORAL_TABLET | Freq: Once | ORAL | Status: DC | PRN

## 2023-06-06 MED ORDER — PROPOFOL 500 MG/50ML IV EMUL
INTRAVENOUS | Status: DC | PRN
  Administered 2023-06-06: 125 ug/kg/min via INTRAVENOUS

## 2023-06-06 MED ORDER — CHLORHEXIDINE GLUCONATE 0.12 % MT SOLN
OROMUCOSAL | Status: AC
  Administered 2023-06-06: 15 mL via OROMUCOSAL
  Filled 2023-06-06: qty 15

## 2023-06-06 MED ORDER — PHENYLEPHRINE 80 MCG/ML (10ML) SYRINGE FOR IV PUSH (FOR BLOOD PRESSURE SUPPORT)
PREFILLED_SYRINGE | INTRAVENOUS | Status: DC | PRN
  Administered 2023-06-06: 40 ug via INTRAVENOUS

## 2023-06-06 MED ORDER — ONDANSETRON HCL 4 MG/2ML IJ SOLN: 4.0000 mg | Freq: Four times a day (QID) | INTRAMUSCULAR | Status: DC | PRN

## 2023-06-06 MED ORDER — EPHEDRINE SULFATE-NACL 50-0.9 MG/10ML-% IV SOSY
PREFILLED_SYRINGE | INTRAVENOUS | Status: DC | PRN
  Administered 2023-06-06 (×2): 5 mg via INTRAVENOUS
  Administered 2023-06-06: 10 mg via INTRAVENOUS

## 2023-06-06 MED ORDER — LIDOCAINE 2% (20 MG/ML) 5 ML SYRINGE
INTRAMUSCULAR | Status: DC | PRN
  Administered 2023-06-06: 60 mg via INTRAVENOUS

## 2023-06-06 MED ORDER — OXYCODONE HCL 5 MG/5ML PO SOLN: 5.0000 mg | Freq: Once | ORAL | Status: DC | PRN

## 2023-06-06 MED ORDER — CHLORHEXIDINE GLUCONATE 0.12 % MT SOLN: 15.0000 mL | Freq: Once | OROMUCOSAL | Status: AC

## 2023-06-06 MED ORDER — FENTANYL CITRATE (PF) 100 MCG/2ML IJ SOLN: 25.0000 ug | INTRAMUSCULAR | Status: DC | PRN

## 2023-06-06 MED ORDER — FENTANYL CITRATE (PF) 100 MCG/2ML IJ SOLN
INTRAMUSCULAR | Status: AC
  Filled 2023-06-06: qty 2

## 2023-06-06 MED ORDER — PROPOFOL 1000 MG/100ML IV EMUL
INTRAVENOUS | Status: AC
  Filled 2023-06-06: qty 100

## 2023-06-06 MED ORDER — PHENYLEPHRINE HCL-NACL 20-0.9 MG/250ML-% IV SOLN
INTRAVENOUS | Status: DC | PRN
  Administered 2023-06-06: 100 ug/min via INTRAVENOUS

## 2023-06-06 MED ORDER — FENTANYL CITRATE (PF) 250 MCG/5ML IJ SOLN
INTRAMUSCULAR | Status: DC | PRN
  Administered 2023-06-06: 50 ug via INTRAVENOUS

## 2023-06-06 MED ORDER — LACTATED RINGERS IV SOLN: INTRAVENOUS | Status: DC

## 2023-06-06 MED ORDER — PROPOFOL 10 MG/ML IV BOLUS
INTRAVENOUS | Status: DC | PRN
  Administered 2023-06-06: 50 mg via INTRAVENOUS
  Administered 2023-06-06: 150 mg via INTRAVENOUS

## 2023-06-06 MED ORDER — ROCURONIUM BROMIDE 10 MG/ML (PF) SYRINGE
PREFILLED_SYRINGE | INTRAVENOUS | Status: DC | PRN
  Administered 2023-06-06: 50 mg via INTRAVENOUS

## 2023-06-06 MED ORDER — ONDANSETRON HCL 4 MG/2ML IJ SOLN
INTRAMUSCULAR | Status: DC | PRN
  Administered 2023-06-06: 4 mg via INTRAVENOUS

## 2023-06-06 SURGICAL SUPPLY — 1 items: Superlock Fiducial Marker IMPLANT

## 2023-06-06 NOTE — Anesthesia Preprocedure Evaluation (Signed)
Anesthesia Evaluation  Patient identified by MRN, date of birth, ID band Patient awake    Reviewed: Allergy & Precautions, H&P , NPO status , Patient's Chart, lab work & pertinent test results  Airway Mallampati: II   Neck ROM: full    Dental   Pulmonary shortness of breath, COPD, Current Smoker and Patient abstained from smoking.   breath sounds clear to auscultation       Cardiovascular negative cardio ROS  Rhythm:regular Rate:Normal     Neuro/Psych  PSYCHIATRIC DISORDERS  Depression       GI/Hepatic PUD,,,  Endo/Other  diabetes, Type 2Hypothyroidism    Renal/GU      Musculoskeletal  (+) Arthritis ,    Abdominal   Peds  Hematology   Anesthesia Other Findings   Reproductive/Obstetrics                             Anesthesia Physical Anesthesia Plan  ASA: 3  Anesthesia Plan: General   Post-op Pain Management:    Induction: Intravenous  PONV Risk Score and Plan: 1 and Ondansetron, Dexamethasone and Treatment may vary due to age or medical condition  Airway Management Planned: Oral ETT  Additional Equipment:   Intra-op Plan:   Post-operative Plan: Extubation in OR  Informed Consent: I have reviewed the patients History and Physical, chart, labs and discussed the procedure including the risks, benefits and alternatives for the proposed anesthesia with the patient or authorized representative who has indicated his/her understanding and acceptance.     Dental advisory given  Plan Discussed with: CRNA, Anesthesiologist and Surgeon  Anesthesia Plan Comments:        Anesthesia Quick Evaluation

## 2023-06-06 NOTE — Transfer of Care (Signed)
Immediate Anesthesia Transfer of Care Note  Patient: Leonard Walton  Procedure(s) Performed: ROBOTIC ASSISTED NAVIGATIONAL BRONCHOSCOPY BRONCHIAL NEEDLE ASPIRATION BIOPSIES BRONCHIAL BRUSHINGS BRONCHIAL BIOPSIES FIDUCIAL MARKER PLACEMENT  Patient Location: Endoscopy Unit  Anesthesia Type:General  Level of Consciousness: awake, alert , and patient cooperative  Airway & Oxygen Therapy: Patient Spontanous Breathing and Patient connected to nasal cannula oxygen  Post-op Assessment: Report given to RN and Post -op Vital signs reviewed and stable  Post vital signs: Reviewed and stable  Last Vitals:  Vitals Value Taken Time  BP 108/64 06/06/23 1458  Temp    Pulse 78 06/06/23 1459  Resp    SpO2 95 % 06/06/23 1459  Vitals shown include unfiled device data.  Last Pain:  Vitals:   06/06/23 1052  PainSc: 0-No pain         Complications: No notable events documented.

## 2023-06-06 NOTE — Op Note (Signed)
Video Bronchoscopy with Robotic Assisted Bronchoscopic Navigation   Date of Operation: 06/06/2023   Pre-op Diagnosis: Bilateral pulmonary nodules  Post-op Diagnosis: Same  Surgeon: Levy Pupa  Assistants: Janann Colonel  Anesthesia: General endotracheal anesthesia  Operation: Flexible video fiberoptic bronchoscopy with robotic assistance and biopsies.  Estimated Blood Loss: Minimal  Complications: None  Indications and History: Leonard Walton is a 72 y.o. male with history of tobacco use.  He displays in lung cancer screening program.  He has a new right lower lobe pulmonary nodule, also available lobe pulmonary nodule medically stable for 1 year on serial imaging.  Recommendation made to achieve a tissue diagnosis via robotic assisted navigational bronchoscopy. The risks, benefits, complications, treatment options and expected outcomes were discussed with the patient.  The possibilities of pneumothorax, pneumonia, reaction to medication, pulmonary aspiration, perforation of a viscus, bleeding, failure to diagnose a condition and creating a complication requiring transfusion or operation were discussed with the patient who freely signed the consent.    Description of Procedure: The patient was seen in the Preoperative Area, was examined and was deemed appropriate to proceed.  The patient was taken to [facility] endoscopy room 3, identified as Grover Canavan and the procedure verified as Flexible Video Fiberoptic Bronchoscopy.  A Time Out was held and the above information confirmed.   Prior to the date of the procedure a high-resolution CT scan of the chest was performed. Utilizing ION software program a virtual tracheobronchial tree was generated to allow the creation of distinct navigation pathways to the patient's parenchymal abnormalities. After being taken to the operating room general anesthesia was initiated and the patient  was orally intubated. The video fiberoptic bronchoscope was  introduced via the endotracheal tube and a general inspection was performed which showed normal right and left lung anatomy. Aspiration of the bilateral mainstems was completed to remove any remaining secretions. Robotic catheter inserted into patient's endotracheal tube.   Target #1 right lower lobe pulmonary nodule: The distinct navigation pathways prepared prior to this procedure were then utilized to navigate to patient's lesion identified on CT scan. The robotic catheter was secured into place and the vision probe was withdrawn.  Lesion location was approximated using fluoroscopy.  Local registration and targeting was performed using Cios three-dimensional imaging. Under fluoroscopic guidance transbronchial needle brushings, transbronchial needle biopsies, and transbronchial forceps biopsies were performed to be sent for cytology and pathology.  Under fluoroscopic guidance a single fiducial marker was placed adjacent to the nodule.  Target #2 left lower lobe pulmonary nodule: The distinct navigation pathways prepared prior to this procedure were then utilized to navigate to patient's lesion identified on CT scan. The robotic catheter was secured into place and the vision probe was withdrawn.  Lesion location was approximated using fluoroscopy.  Local registration and targeting was performed using Cios three-dimensional imaging. Under fluoroscopic guidance transbronchial brushings were performed to be sent for cytology and pathology.   At the end of the procedure a general airway inspection was performed and there was no evidence of active bleeding. The bronchoscope was removed.  The patient tolerated the procedure well. There was no significant blood loss and there were no obvious complications. A post-procedural chest x-ray is pending.  Samples Target #1: 1. Transbronchial needle brushings from right lower lobe nodule 2. Transbronchial Wang needle biopsies from right lower lobe nodule 3.  Transbronchial forceps biopsies from right lower lobe nodule  Samples Target #2: 1. Transbronchial brushings from left lower lobe nodule   Plans:  The patient will be discharged from the PACU to home when recovered from anesthesia and after chest x-ray is reviewed. We will review the cytology, pathology and microbiology results with the patient when they become available. Outpatient followup will be with Dr. Delton Coombes.   Levy Pupa, MD, PhD 06/06/2023, 2:55 PM [facility] [phone] or if no answer before 7:00PM call [phone] For any issues after 7:00PM please call eLink [phone]

## 2023-06-06 NOTE — Progress Notes (Signed)
This RN spoke with Dr. Delton Coombes, physician said pt okay to be discharged pending radiology results.

## 2023-06-06 NOTE — Discharge Instructions (Addendum)
Flexible Bronchoscopy, Care After This sheet gives you information about how to care for yourself after your test. Your doctor may also give you more specific instructions. If you have problems or questions, contact your doctor. Follow these instructions at home: Eating and drinking Do not eat or drink anything (not even water) for 2 hours after your test, or until your numbing medicine (local anesthetic) wears off. When your numbness is gone and your cough and gag reflexes have come back, you may: Eat only soft foods. Slowly drink liquids. The day after the test, go back to your normal diet. Driving Do not drive for 24 hours if you were given a medicine to help you relax (sedative). Do not drive or use heavy machinery while taking prescription pain medicine. General instructions  Take over-the-counter and prescription medicines only as told by your doctor. Return to your normal activities as told. Ask what activities are safe for you. Do not use any products that have nicotine or tobacco in them. This includes cigarettes and e-cigarettes. If you need help quitting, ask your doctor. Keep all follow-up visits as told by your doctor. This is important. It is very important if you had a tissue sample (biopsy) taken. Get help right away if: You have shortness of breath that gets worse. You get light-headed. You feel like you are going to pass out (faint). You have chest pain. You cough up: More than a little blood. More blood than before. Summary Do not eat or drink anything (not even water) for 2 hours after your test, or until your numbing medicine wears off. Do not use cigarettes. Do not use e-cigarettes. Get help right away if you have chest pain.  Please call our office for any questions or concerns.  [phone].     Yeah This information is not intended to replace advice given to you by your health care provider. Make sure you discuss any questions you have with your health  care provider. Document Released: 07/04/2009 Document Revised: 08/19/2017 Document Reviewed: 09/24/2016 Elsevier Patient Education  2020 [employer].

## 2023-06-06 NOTE — Interval H&P Note (Signed)
History and Physical Interval Note:  06/06/2023 11:47 AM  Leonard Walton  has presented today for surgery, with the diagnosis of RIGHT LOWER LOBE PULMONARY NODULE.  The various methods of treatment have been discussed with the patient and family. After consideration of risks, benefits and other options for treatment, the patient has consented to  Procedure(s): ROBOTIC ASSISTED NAVIGATIONAL BRONCHOSCOPY (N/A) as a surgical intervention.  The patient's history has been reviewed, patient examined, no change in status, stable for surgery.  I have reviewed the patient's chart and labs.  Questions were answered to the patient's satisfaction.     Leslye Peer

## 2023-06-07 ENCOUNTER — Encounter (HOSPITAL_COMMUNITY): Payer: Self-pay | Admitting: Emergency Medicine

## 2023-06-07 DIAGNOSIS — N210 Calculus in bladder: Secondary | ICD-10-CM | POA: Diagnosis not present

## 2023-06-07 DIAGNOSIS — R310 Gross hematuria: Secondary | ICD-10-CM | POA: Diagnosis not present

## 2023-06-07 DIAGNOSIS — R3912 Poor urinary stream: Secondary | ICD-10-CM | POA: Diagnosis not present

## 2023-06-07 DIAGNOSIS — D414 Neoplasm of uncertain behavior of bladder: Secondary | ICD-10-CM | POA: Diagnosis not present

## 2023-06-09 DIAGNOSIS — R8289 Other abnormal findings on cytological and histological examination of urine: Secondary | ICD-10-CM | POA: Diagnosis not present

## 2023-06-09 DIAGNOSIS — D414 Neoplasm of uncertain behavior of bladder: Secondary | ICD-10-CM | POA: Diagnosis not present

## 2023-06-09 DIAGNOSIS — N210 Calculus in bladder: Secondary | ICD-10-CM | POA: Diagnosis not present

## 2023-06-09 DIAGNOSIS — R3912 Poor urinary stream: Secondary | ICD-10-CM | POA: Diagnosis not present

## 2023-06-09 NOTE — Anesthesia Postprocedure Evaluation (Signed)
Anesthesia Post Note  Patient: Leonard Walton  Procedure(s) Performed: ROBOTIC ASSISTED NAVIGATIONAL BRONCHOSCOPY BRONCHIAL NEEDLE ASPIRATION BIOPSIES BRONCHIAL BRUSHINGS BRONCHIAL BIOPSIES FIDUCIAL MARKER PLACEMENT     Patient location during evaluation: PACU Anesthesia Type: General Level of consciousness: awake and alert Pain management: pain level controlled Vital Signs Assessment: post-procedure vital signs reviewed and stable Respiratory status: spontaneous breathing, nonlabored ventilation, respiratory function stable and patient connected to nasal cannula oxygen Cardiovascular status: blood pressure returned to baseline and stable Postop Assessment: no apparent nausea or vomiting Anesthetic complications: no   No notable events documented.  Last Vitals:  Vitals:   06/06/23 1540 06/06/23 1600  BP: (!) 106/59 109/63  Pulse: 70 64  Resp: (!) 21 (!) 27  Temp:    SpO2: 94% 90%    Last Pain:  Vitals:   06/06/23 1600  TempSrc:   PainSc: 0-No pain                 Lenville Hibberd S

## 2023-06-10 ENCOUNTER — Telehealth: Payer: Self-pay | Admitting: Emergency Medicine

## 2023-06-10 DIAGNOSIS — C3490 Malignant neoplasm of unspecified part of unspecified bronchus or lung: Secondary | ICD-10-CM

## 2023-06-10 NOTE — Telephone Encounter (Signed)
Right lower lobe biopsies consistent with adenocarcinoma, the stable left lower lobe nodule was negative.  Discussed this with him today.  He has follow-up in our office with T. Parrett to ensure plan is in place.  I will order a PET scan.  I recommended surgical referral.

## 2023-06-16 ENCOUNTER — Other Ambulatory Visit: Payer: Self-pay

## 2023-06-16 ENCOUNTER — Ambulatory Visit: Payer: Medicare Other | Admitting: Adult Health

## 2023-06-16 ENCOUNTER — Encounter: Payer: Self-pay | Admitting: Adult Health

## 2023-06-16 VITALS — BP 126/60 | HR 69 | Temp 97.4°F | Ht 67.0 in | Wt 200.0 lb

## 2023-06-16 DIAGNOSIS — C3431 Malignant neoplasm of lower lobe, right bronchus or lung: Secondary | ICD-10-CM | POA: Diagnosis not present

## 2023-06-16 DIAGNOSIS — J449 Chronic obstructive pulmonary disease, unspecified: Secondary | ICD-10-CM | POA: Diagnosis not present

## 2023-06-16 DIAGNOSIS — F17210 Nicotine dependence, cigarettes, uncomplicated: Secondary | ICD-10-CM | POA: Diagnosis not present

## 2023-06-16 DIAGNOSIS — C3490 Malignant neoplasm of unspecified part of unspecified bronchus or lung: Secondary | ICD-10-CM | POA: Insufficient documentation

## 2023-06-16 NOTE — Assessment & Plan Note (Signed)
Mild to moderate COPD with FEV1 at 75%, ratio 69 FVC at 79%.  Normal diffusing capacity.  We discussed smoking cessation in detail.  Patient is continue on Breztri twice daily.  Plan Patient Instructions  Use Breztri 2 puffs Twice daily, rinse after use.  Albuterol inhaler As needed   PET scan as planned Follow up with Thoracic surgeon as planned.  Work on not smoking.  Activity as tolerated.  Follow up with Leonard Walton  or Leonard Consuegra NP in 3 months and As needed

## 2023-06-16 NOTE — Assessment & Plan Note (Signed)
Lung cancer-non-small cell lung cancer-adenocarcinoma.  CT chest July 2024 showed a 10 mm right lower lobe nodule.  Navigational bronchoscopy June 06, 2023 with cytology positive for non-small cell carcinoma, adenocarcinoma.  Currently appears to be stage I with no adenopathy on CT scan.  PET scan is pending.  Has upcoming appointment with thoracic surgery to see if he is a candidate for surgical resection.  PFTs done earlier this month shows mild to moderate COPD with only slightly decreased FEV1 at 75%.  Diffusing capacity is normal at 84%. For now we will await PET scan results and thoracic surgery consult  Plan  Patient Instructions  Use Breztri 2 puffs Twice daily, rinse after use.  Albuterol inhaler As needed   PET scan as planned Follow up with Thoracic surgeon as planned.  Work on not smoking.  Activity as tolerated.  Follow up with Dr. Delton Coombes  or Idamay Hosein NP in 3 months and As needed

## 2023-06-16 NOTE — Progress Notes (Signed)
@Patient  ID: Leonard Walton, male    DOB: 10-23-1950, 72 y.o.   MRN: [id number]  Chief Complaint  Patient presents with   Follow-up    Referring provider: Mila Palmer, MD  HPI: 72 year old male active smoker seen for pulmonary consult May 13, 2023 for abnormal CT chest with lung nodule found to have Lung cancer -NSCLC  .  Emphysema noted on CT scan.  TEST/EVENTS :   06/16/2023 Follow up : Lung cancer  Patient presents for a 1 month follow-up.  Patient was seen last visit for a pulmonary consult for abnormal CT chest.  Patient is an active smoker.  He follows with the lung cancer CT screening program.  CT chest done on April 11, 2023 showed a suspicious new nodule measuring 10.3 mm nodule in the right lower lobe and a 7 mm left lower lobe nodule.  Negative for adenopathy.  He underwent navigational bronchoscopy June 06, 2023 no endobronchial lesions were noted.  Biopsies of the right lower lobe nodule and left lower lobe nodule were completed.  Cytology for the right lower lobe nodule was positive for non-small cell carcinoma, adenocarcinoma.  Left lower lobe nodule cytology was negative for malignant cells.  Patient is accompanied by his wife.  We went over his test results in detail.  Patient has been set up for a PET scan that is planned for October 10.  He underwent PFTs that were done May 30, 2023 that showed mild to moderate COPD.  FEV1 at 75%, ratio 69, FVC 79%, DLCO 84%.  Patient is on Breztri twice daily.  He does admit that he does not take on a consistent basis.  Patient does continue to smoke but is crying to cut back and is planning on quitting.  We had a long discussion regarding smoking cessation.  Patient has been set up with thoracic surgery, Dr. Dorris Fetch next week.  Patient says he has a good appetite.  No unintentional weight loss.  No hemoptysis, chest pain, orthopnea.  He is fully independent.  Is able to drive.  Lives at home.    No Known  Allergies  Immunization History  Administered Date(s) Administered   Influenza Whole 07/21/2005   Tdap 05/04/2023    Past Medical History:  Diagnosis Date   Arthritis    BPH (benign prostatic hyperplasia)    Depression    Diabetes mellitus (HCC)    Hyperlipidemia    Hypothyroid    Low back pain    hx - occaional but not often per patient on 06/03/23   Low serum vitamin B12    Numbness    hx  - hands and feet   Pneumonia    x 1 yrs ago   PUD (peptic ulcer disease)     Tobacco History: Social History   Tobacco Use  Smoking Status Every Day   Current packs/day: 0.50   Types: E-cigarettes, Cigarettes  Smokeless Tobacco Never  Tobacco Comments   1 pack will last 3-4 days.  Trying to quit.  06/16/2023 hfb   Ready to quit: Not Answered Counseling given: Not Answered Tobacco comments: 1 pack will last 3-4 days.  Trying to quit.  06/16/2023 hfb   Outpatient Medications Prior to Visit  Medication Sig Dispense Refill   aspirin EC 81 MG tablet Take 81 mg by mouth daily.     b complex vitamins capsule Take 1 capsule by mouth daily.     ciprofloxacin (CIPRO) 500 MG tablet Take 500 mg by mouth 2 (  two) times daily.     levothyroxine (SYNTHROID, LEVOTHROID) 75 MCG tablet Take 75 mcg by mouth daily.  0   metFORMIN (GLUCOPHAGE) 500 MG tablet Take 500 mg by mouth 2 (two) times daily with a meal.   5   naproxen sodium (ALEVE) 220 MG tablet Take 220 mg by mouth daily. Tx arthritis     rosuvastatin (CRESTOR) 5 MG tablet Take 5 mg by mouth daily.     tamsulosin (FLOMAX) 0.4 MG CAPS capsule Take 0.4 mg by mouth daily.     VITAMIN D PO Take 2,000 Units by mouth daily.     Budeson-Glycopyrrol-Formoterol (BREZTRI AEROSPHERE) 160-9-4.8 MCG/ACT AERO Inhale 2 puffs into the lungs in the morning and at bedtime. (Patient not taking: Reported on 06/16/2023) 5.9 g 11   No facility-administered medications prior to visit.     Review of Systems:   Constitutional:   No  weight loss, night sweats,   Fevers, chills, fatigue, or  lassitude.  HEENT:   No headaches,  Difficulty swallowing,  Tooth/dental problems, or  Sore throat,                No sneezing, itching, ear ache, nasal congestion, post nasal drip,   CV:  No chest pain,  Orthopnea, PND, swelling in lower extremities, anasarca, dizziness, palpitations, syncope.   GI  No heartburn, indigestion, abdominal pain, nausea, vomiting, diarrhea, change in bowel habits, loss of appetite, bloody stools.   Resp:  No excess mucus, no productive cough,  No non-productive cough,  No coughing up of blood.  No change in color of mucus.  No wheezing.  No chest wall deformity  Skin: no rash or lesions.  GU: no dysuria, change in color of urine, no urgency or frequency.  No flank pain, no hematuria   MS:  No joint pain or swelling.  No decreased range of motion.  No back pain.    Physical Exam  BP 126/60 (BP Location: Left Arm, Patient Position: Sitting, Cuff Size: Normal)   Pulse 69   Temp (!) 97.4 F (36.3 C) (Oral)   Ht 5\' 7"  (1.702 m)   Wt 200 lb (90.7 kg)   SpO2 99%   BMI 31.32 kg/m   GEN: A/Ox3; pleasant , NAD, well nourished    HEENT:  /AT,   NOSE-clear, THROAT-clear, no lesions, no postnasal drip or exudate noted.   NECK:  Supple w/ fair ROM; no JVD; normal carotid impulses w/o bruits; no thyromegaly or nodules palpated; no lymphadenopathy.    RESP  Clear  P & A; w/o, wheezes/ rales/ or rhonchi. no accessory muscle use, no dullness to percussion  CARD:  RRR, no m/r/g, no peripheral edema, pulses intact, no cyanosis or clubbing.  GI:   Soft & nt; nml bowel sounds; no organomegaly or masses detected.   Musco: Warm bil, no deformities or joint swelling noted.   Neuro: alert, no focal deficits noted.    Skin: Warm, no lesions or rashes    Lab Results:     BNP No results found for: "BNP"  ProBNP No results found for: "PROBNP"  Imaging: CT SUPER D CHEST WO CONTRAST  Result Date: 06/13/2023 CLINICAL DATA:   Pulmonary nodules. EXAM: CT CHEST WITHOUT CONTRAST TECHNIQUE: Multidetector CT imaging of the chest was performed using thin slice collimation for electromagnetic bronchoscopy planning purposes, without intravenous contrast. RADIATION DOSE REDUCTION: This exam was performed according to the departmental dose-optimization program which includes automated exposure control, adjustment of the mA and/or  kV according to patient size and/or use of iterative reconstruction technique. COMPARISON:  04/11/2023 FINDINGS: Cardiovascular: The heart size is normal. No substantial pericardial effusion. Coronary artery calcification is evident. Mild atherosclerotic calcification is noted in the wall of the thoracic aorta. Mediastinum/Nodes: No mediastinal lymphadenopathy. No evidence for gross hilar lymphadenopathy although assessment is limited by the lack of intravenous contrast on the current study. The esophagus has normal imaging features. There is no axillary lymphadenopathy. Lungs/Pleura: 10 mm irregular/spiculated lesion identified in the retro hilar right posterior lung. While possibly a posterior right middle lobe lesion, this is likely in the lower lobe although sagittal imaging shows it projecting along the course of the minor fissure with apparent variant fissural anatomy in the right lower lobe suggesting superior accessory fissure (sagittal image 51 of series 4). 5 mm right upper lobe perifissural nodule on 86/5 is stable. 7 mm left lower lobe nodule also unchanged in the interval. No focal consolidation or pleural effusion. Upper Abdomen: Visualized portion of the upper abdomen is unremarkable. Musculoskeletal: No worrisome lytic or sclerotic osseous abnormality. IMPRESSION: 1. 10 mm irregular/spiculated lesion in the retro hilar right posterior lung. This is likely in the lower lobe although sagittal imaging shows it projecting along the course of the minor fissure with apparent variant fissural anatomy in the right  lower lobe. Imaging features remain worrisome for neoplasm. 2. Additional bilateral pulmonary nodules are stable in the interval. 3.  Aortic Atherosclerosis (ICD10-I70.0). Electronically Signed   By: Kennith Center M.D.   On: 06/13/2023 14:14   DG Chest Port 1 View  Result Date: 06/06/2023 CLINICAL DATA:  Status post bronchoscopy, biopsy EXAM: PORTABLE CHEST 1 VIEW COMPARISON:  05/04/2023, 06/02/2023 FINDINGS: Single frontal view of the chest demonstrates an unremarkable cardiac silhouette. Fiduciary marker identified at the site of the spiculated right lower lobe nodule seen on prior CT. No acute airspace disease, effusion, or pneumothorax. No acute bony abnormality. IMPRESSION: 1. No complication after bronchoscopy and biopsy of a known right lower lobe nodule. Electronically Signed   By: Sharlet Salina M.D.   On: 06/06/2023 17:02   DG C-ARM BRONCHOSCOPY  Result Date: 06/06/2023 C-ARM BRONCHOSCOPY: Fluoroscopy was utilized by the requesting physician.  No radiographic interpretation.    Administration History     None          Latest Ref Rng & Units 05/30/2023    2:49 PM  PFT Results  FVC-Pre L 3.04  P  FVC-Predicted Pre % 79  P  FVC-Post L 3.28  P  FVC-Predicted Post % 85  P  Pre FEV1/FVC % % 69  P  Post FEV1/FCV % % 32  P  FEV1-Pre L 2.09  P  FEV1-Predicted Pre % 75  P  FEV1-Post L 1.05  P  DLCO uncorrected ml/min/mmHg 19.67  P  DLCO UNC% % 84  P  DLCO corrected ml/min/mmHg 18.82  P  DLCO COR %Predicted % 81  P  DLVA Predicted % 89  P  TLC L 5.88  P  TLC % Predicted % 91  P  RV % Predicted % 98  P    P Preliminary result    No results found for: "NITRICOXIDE"      Assessment & Plan:   Lung cancer (HCC) Lung cancer-non-small cell lung cancer-adenocarcinoma.  CT chest July 2024 showed a 10 mm right lower lobe nodule.  Navigational bronchoscopy June 06, 2023 with cytology positive for non-small cell carcinoma, adenocarcinoma.  Currently appears to be stage I with  no adenopathy on CT scan.  PET scan is pending.  Has upcoming appointment with thoracic surgery to see if he is a candidate for surgical resection.  PFTs done earlier this month shows mild to moderate COPD with only slightly decreased FEV1 at 75%.  Diffusing capacity is normal at 84%. For now we will await PET scan results and thoracic surgery consult  Plan  Patient Instructions  Use Breztri 2 puffs Twice daily, rinse after use.  Albuterol inhaler As needed   PET scan as planned Follow up with Thoracic surgeon as planned.  Work on not smoking.  Activity as tolerated.  Follow up with Dr. Delton Coombes  or Bernardette Waldron NP in 3 months and As needed        COPD (chronic obstructive pulmonary disease) (HCC) Mild to moderate COPD with FEV1 at 75%, ratio 69 FVC at 79%.  Normal diffusing capacity.  We discussed smoking cessation in detail.  Patient is continue on Breztri twice daily.  Plan Patient Instructions  Use Breztri 2 puffs Twice daily, rinse after use.  Albuterol inhaler As needed   PET scan as planned Follow up with Thoracic surgeon as planned.  Work on not smoking.  Activity as tolerated.  Follow up with Dr. Delton Coombes  or Cledith Abdou NP in 3 months and As needed           Rubye Oaks, NP 06/16/2023

## 2023-06-16 NOTE — Patient Instructions (Addendum)
Use Breztri 2 puffs Twice daily, rinse after use.  Albuterol inhaler As needed   PET scan as planned Follow up with Thoracic surgeon as planned.  Work on not smoking.  Activity as tolerated.  Follow up with Dr. Delton Coombes  or Mahek Schlesinger NP in 3 months and As needed

## 2023-06-17 NOTE — Progress Notes (Signed)
The proposed treatment discussed in conference is for discussion purpose only and is not a binding recommendation.  The patients have not been physically examined, or presented with their treatment options.  Therefore, final treatment plans cannot be decided.  

## 2023-06-23 ENCOUNTER — Encounter: Payer: Self-pay | Admitting: Thoracic Surgery (Cardiothoracic Vascular Surgery)

## 2023-06-23 ENCOUNTER — Institutional Professional Consult (permissible substitution): Payer: Medicare Other | Admitting: Thoracic Surgery (Cardiothoracic Vascular Surgery)

## 2023-06-23 VITALS — BP 145/76 | HR 66 | Resp 20 | Ht 67.0 in | Wt 202.6 lb

## 2023-06-23 DIAGNOSIS — C3431 Malignant neoplasm of lower lobe, right bronchus or lung: Secondary | ICD-10-CM | POA: Diagnosis not present

## 2023-06-23 NOTE — Progress Notes (Signed)
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  Leonard Walton is a 72 year old man with a past medical history significant for tobacco abuse, COPD, hyperlipidemia, hypothyroidism, type 2 diabetes complicated by diabetic neuropathy, arthritis, depression, and peptic ulcer disease.  He smoked about a pack a day for 50 years.  Currently down to about 1/3 pack/day.  He had a low-dose CT for lung cancer screening in July.  It showed a new 10 mm spiculated central right lower lobe nodule along with centrilobular and paraseptal emphysema.  There was no mediastinal adenopathy.  He had a robotic bronchoscopy by Dr. Delton Coombes.  It showed non-small cell carcinoma.  As noted he is currently smoking about 1/3 pack/day.  No chest pain, pressure, or tightness.  He does get winded with exertion but can walk up a flight of stairs without stopping and thinks he can go up 2 flights.  Activity is very limited by arthritis in his knees and hips.  No unusual headaches, visual changes, or seizures.  No change in appetite or weight loss.  Zubrod Score: At the time of surgery this patient's most appropriate activity status/level should be described as: []     0    Normal activity, no symptoms [x]     1    Restricted in physical strenuous activity but ambulatory, able to do out light work []     2    Ambulatory and capable of self care, unable to do work activities, up and about >50 % of waking hours                              []     3    Only limited self care, in bed greater than 50% of waking hours []     4    Completely disabled, no self care, confined to bed or chair []     5    Moribund   Past Medical History:  Diagnosis Date   Arthritis    BPH (benign prostatic  hyperplasia)    Depression    Diabetes mellitus (HCC)    Hyperlipidemia    Hypothyroid    Low back pain    hx - occaional but not often per patient on 06/03/23   Low serum vitamin B12    Numbness    hx  - hands and feet   Pneumonia    x 1 yrs ago   PUD (peptic ulcer disease)     Past Surgical History:  Procedure Laterality Date   BACK SURGERY  2009   laminectomy - Dr. Gerlene Fee   BRONCHIAL BIOPSY  06/06/2023   Procedure: BRONCHIAL BIOPSIES;  Surgeon: Leslye Peer, MD;  Location: [facility] ENDOSCOPY;  Service: Pulmonary;;   BRONCHIAL BRUSHINGS  06/06/2023   Procedure: BRONCHIAL BRUSHINGS;  Surgeon: Leslye Peer, MD;  Location: [facility] ENDOSCOPY;  Service: Pulmonary;;   BRONCHIAL NEEDLE ASPIRATION BIOPSY  06/06/2023   Procedure: BRONCHIAL NEEDLE ASPIRATION BIOPSIES;  Surgeon: Leslye Peer, MD;  Location: [facility] ENDOSCOPY;  Service: Pulmonary;;   COLONOSCOPY     FIDUCIAL MARKER PLACEMENT  06/06/2023   Procedure: FIDUCIAL MARKER PLACEMENT;  Surgeon: Leslye Peer, MD;  Location:  [facility] ENDOSCOPY;  Service: Pulmonary;;   KNEE SURGERY Right    x 2   TONSILLECTOMY     UPPER GI ENDOSCOPY      Family History  Problem Relation Age of Onset   CAD Mother        MI at age 82   Heart attack Mother    Lung cancer Father    Throat cancer Sister    Prostate cancer Brother    Ovarian cancer Sister     Social History Social History   Tobacco Use   Smoking status: Every Day    Current packs/day: 0.50    Types: E-cigarettes, Cigarettes   Smokeless tobacco: Never   Tobacco comments:    1 pack will last 3-4 days.  Trying to quit.  06/16/2023 hfb  Vaping Use   Vaping status: Former  Substance Use Topics   Alcohol use: Yes    Alcohol/week: 7.0 - 14.0 standard drinks of alcohol    Types: 7 - 14 Shots of liquor per week    Comment: 1-2 drinks per day   Drug use: No    Current Outpatient Medications  Medication Sig Dispense Refill   aspirin EC 81 MG tablet Take 81 mg by mouth daily.     b  complex vitamins capsule Take 1 capsule by mouth daily.     Budeson-Glycopyrrol-Formoterol (BREZTRI AEROSPHERE) 160-9-4.8 MCG/ACT AERO Inhale 2 puffs into the lungs in the morning and at bedtime. 5.9 g 11   ciprofloxacin (CIPRO) 500 MG tablet Take 500 mg by mouth 2 (two) times daily.     levothyroxine (SYNTHROID, LEVOTHROID) 75 MCG tablet Take 75 mcg by mouth daily.  0   metFORMIN (GLUCOPHAGE) 500 MG tablet Take 500 mg by mouth 2 (two) times daily with a meal.   5   naproxen sodium (ALEVE) 220 MG tablet Take 220 mg by mouth daily. Tx arthritis     rosuvastatin (CRESTOR) 5 MG tablet Take 5 mg by mouth daily.     tamsulosin (FLOMAX) 0.4 MG CAPS capsule Take 0.4 mg by mouth daily.     VITAMIN D PO Take 2,000 Units by mouth daily.     No current facility-administered medications for this visit.    No Known Allergies  Review of Systems  Constitutional:  Positive for fatigue. Negative for activity change, appetite change and unexpected weight change.  HENT:  Positive for trouble swallowing. Negative for voice change.   Eyes:  Negative for visual disturbance.  Respiratory:  Positive for cough, shortness of breath (With exertion) and wheezing (Occasionally at night).   Cardiovascular:  Negative for chest pain and leg swelling.  Gastrointestinal:  Positive for abdominal pain (reflux). Negative for abdominal distention.  Genitourinary:  Negative for difficulty urinating and dysuria.  Musculoskeletal:  Positive for arthralgias, gait problem and joint swelling.  Neurological:  Positive for numbness (Diabetic neuropathy).  Hematological:  Negative for adenopathy. Does not bruise/bleed easily.    BP (!) 145/76 (BP Location: Right Arm, Patient Position: Sitting, Cuff Size: Normal)   Pulse 66   Resp 20   Ht 5\' 7"  (1.702 m)   Wt 202 lb 9.6 oz (91.9 kg)   SpO2 95%   BMI 31.73 kg/m  Physical Exam Vitals reviewed.  Constitutional:      General: He is not in acute distress.    Appearance: Normal  appearance.  HENT:     Head: Normocephalic and atraumatic.  Eyes:     General: No scleral icterus.  Extraocular Movements: Extraocular movements intact.  Neck:     Vascular: No carotid bruit.  Cardiovascular:     Rate and Rhythm: Normal rate and regular rhythm.     Heart sounds: Normal heart sounds. No murmur heard.    No friction rub. No gallop.  Pulmonary:     Effort: Pulmonary effort is normal. No respiratory distress.     Breath sounds: Normal breath sounds. No wheezing.  Abdominal:     General: There is no distension.     Palpations: Abdomen is soft.  Musculoskeletal:     Comments: No clubbing  Lymphadenopathy:     Cervical: No cervical adenopathy.  Skin:    General: Skin is warm and dry.  Neurological:     General: No focal deficit present.     Mental Status: He is alert and oriented to person, place, and time.     Cranial Nerves: No cranial nerve deficit.     Motor: No weakness.     Gait: Gait abnormal.    Diagnostic Tests: CT CHEST WITHOUT CONTRAST   TECHNIQUE: Multidetector CT imaging of the chest was performed using thin slice collimation for electromagnetic bronchoscopy planning purposes, without intravenous contrast.   RADIATION DOSE REDUCTION: This exam was performed according to the departmental dose-optimization program which includes automated exposure control, adjustment of the mA and/or kV according to patient size and/or use of iterative reconstruction technique.   COMPARISON:  04/11/2023   FINDINGS: Cardiovascular: The heart size is normal. No substantial pericardial effusion. Coronary artery calcification is evident. Mild atherosclerotic calcification is noted in the wall of the thoracic aorta.   Mediastinum/Nodes: No mediastinal lymphadenopathy. No evidence for gross hilar lymphadenopathy although assessment is limited by the lack of intravenous contrast on the current study. The esophagus has normal imaging features. There is no  axillary lymphadenopathy.   Lungs/Pleura: 10 mm irregular/spiculated lesion identified in the retro hilar right posterior lung. While possibly a posterior right middle lobe lesion, this is likely in the lower lobe although sagittal imaging shows it projecting along the course of the minor fissure with apparent variant fissural anatomy in the right lower lobe suggesting superior accessory fissure (sagittal image 51 of series 4). 5 mm right upper lobe perifissural nodule on 86/5 is stable. 7 mm left lower lobe nodule also unchanged in the interval. No focal consolidation or pleural effusion.   Upper Abdomen: Visualized portion of the upper abdomen is unremarkable.   Musculoskeletal: No worrisome lytic or sclerotic osseous abnormality.   IMPRESSION: 1. 10 mm irregular/spiculated lesion in the retro hilar right posterior lung. This is likely in the lower lobe although sagittal imaging shows it projecting along the course of the minor fissure with apparent variant fissural anatomy in the right lower lobe. Imaging features remain worrisome for neoplasm. 2. Additional bilateral pulmonary nodules are stable in the interval. 3.  Aortic Atherosclerosis (ICD10-I70.0).     Electronically Signed   By: Kennith Center M.D.   On: 06/13/2023 14:14 I personally reviewed the CT images.  10 mm spiculated nodule posterior central right lower lobe.  5 mm nodule in the middle lobe, likely subpleural lymph node.  Stable 6 mm nodule left lower lobe.  No mediastinal or hilar adenopathy.  Aortic atherosclerosis.  Mild coronary calcification.  Impression: Leonard Walton is a 72 year old man with a past medical history significant for tobacco abuse, COPD, hyperlipidemia, hypothyroidism, type 2 diabetes complicated by diabetic neuropathy, arthritis, depression, and peptic ulcer disease.  Found to have a right  lower lobe lung nodule on low-dose CT for lung cancer screening.  Biopsy showed non-small cell  carcinoma.  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  Obviously we want to see the PET scan but I would be surprised if it has findings that would change staging.  I discussed the treatment options for stage Ia lung cancer.  That includes surgical resection and radiation.  I emphasized that these are not equivalent treatments and that surgical resection is the gold standard.  We did discuss the advantages and disadvantages of each.  I discussed the proposed operative procedure with Mr. and Mrs. Verrastro.  The plan would be to do a robotic assisted right lower lobectomy.  Anatomy is not favorable for a segmentectomy.  I informed him of the general nature of the procedure including the incisions to be used, the use of the surgical robot, the use of a drainage tube postoperatively, the expected hospital stay, and the overall recovery.  I informed them of the indications, risks, benefits, and alternatives.  They understand the risks include, but not limited to death, MI, DVT, PE, bleeding, possible need for transfusion, infection, prolonged air leak, cardiac arrhythmias, as well as possibility of other unforeseeable complications.  He is uncertain if he wants to proceed with surgery.  He wants to think it over and will let us know if he would like to schedule.   Plan: Patient will call to let us know if he would like to proceed with robotic assisted right lower lobectomy  Loreli Slot, MD [facility] [phone]

## 2023-06-24 ENCOUNTER — Other Ambulatory Visit: Payer: Self-pay | Admitting: Urology

## 2023-06-27 ENCOUNTER — Other Ambulatory Visit: Payer: Self-pay | Admitting: *Deleted

## 2023-06-27 ENCOUNTER — Encounter: Payer: Self-pay | Admitting: *Deleted

## 2023-06-27 DIAGNOSIS — C3431 Malignant neoplasm of lower lobe, right bronchus or lung: Secondary | ICD-10-CM

## 2023-06-30 ENCOUNTER — Encounter (HOSPITAL_COMMUNITY)
Admission: RE | Admit: 2023-06-30 | Discharge: 2023-06-30 | Disposition: A | Discharge status: Home / Self Care | Payer: Medicare Other | Source: Ambulatory Visit | Attending: Emergency Medicine | Admitting: Emergency Medicine

## 2023-06-30 DIAGNOSIS — N210 Calculus in bladder: Secondary | ICD-10-CM | POA: Diagnosis not present

## 2023-06-30 DIAGNOSIS — N200 Calculus of kidney: Secondary | ICD-10-CM | POA: Diagnosis not present

## 2023-06-30 DIAGNOSIS — C3490 Malignant neoplasm of unspecified part of unspecified bronchus or lung: Secondary | ICD-10-CM | POA: Insufficient documentation

## 2023-06-30 DIAGNOSIS — K5730 Diverticulosis of large intestine without perforation or abscess without bleeding: Secondary | ICD-10-CM | POA: Diagnosis not present

## 2023-06-30 LAB — GLUCOSE, CAPILLARY: Glucose-Capillary: 148 mg/dL — ABNORMAL HIGH (ref 70–99)

## 2023-06-30 MED ORDER — FLUDEOXYGLUCOSE F - 18 (FDG) INJECTION
10.1000 | Freq: Once | INTRAVENOUS | Status: AC
  Administered 2023-06-30: 10.02 via INTRAVENOUS

## 2023-07-25 DIAGNOSIS — N210 Calculus in bladder: Secondary | ICD-10-CM | POA: Diagnosis not present

## 2023-07-25 DIAGNOSIS — N390 Urinary tract infection, site not specified: Secondary | ICD-10-CM | POA: Diagnosis not present

## 2023-07-25 DIAGNOSIS — Z01818 Encounter for other preprocedural examination: Secondary | ICD-10-CM | POA: Diagnosis not present

## 2023-07-25 DIAGNOSIS — R310 Gross hematuria: Secondary | ICD-10-CM | POA: Diagnosis not present

## 2023-07-25 DIAGNOSIS — D414 Neoplasm of uncertain behavior of bladder: Secondary | ICD-10-CM | POA: Diagnosis not present

## 2023-07-26 ENCOUNTER — Encounter (HOSPITAL_BASED_OUTPATIENT_CLINIC_OR_DEPARTMENT_OTHER): Payer: Self-pay | Admitting: Urology

## 2023-07-26 NOTE — Progress Notes (Signed)
On 07-26-2023  chart reviewed w/ anesthesia Dr Ace Gins MDA.  Dr Ace Gins MDA stated pt ok to proceed @ [name]  On 07-27-2023 Spoke w/ via phone for pre-op interview--- pt Lab needs dos----   [employer] results------ current EKG in epic/ chart COVID test -----patient states asymptomatic no test needed Arrive at ------- 1000 on 08-01-2023 NPO after MN NO Solid Food.  Clear liquids from MN until--- 0900 Med rec completed Medications to take morning of surgery ----- crestor, flomax, synthroid, breztri inhaler Diabetic medication ----- do not take metformin morning of surgery Patient instructed no nail polish to be worn day of surgery Patient instructed to bring photo id and insurance card day of surgery Patient aware to have Driver (ride ) / caregiver    for 24 hours after surgery - sig other, tracy Patient Special Instructions -----  asked to bring rescue inhaler dos Pre-Op special Instructions -----  n/a Patient verbalized understanding of instructions that were given at this phone interview. Patient denies chest pain, sob, fever, cough at the interview.    Anesthesia Review:  see above   PCP:  Dr Kathie Rhodes. [facility] Pul[facility]ology:  Dr Delton Coombes Chest x-ray :  CT 06-02-2023 EKG : 05-04-2023  Fasting Blood Sugar :   100--120s   / Checks Blood Sugar -- times a day:  daily in am Blood Thinner/ Instructions /Last Dose:  no ASA / Instructions/ Last Dose : ASA 81mg ,  pt stated given instructions from Dr Cardell Peach office to stop week prior to surgery, last dose 11/01-2023

## 2023-07-27 ENCOUNTER — Encounter (HOSPITAL_BASED_OUTPATIENT_CLINIC_OR_DEPARTMENT_OTHER): Payer: Self-pay | Admitting: Urology

## 2023-08-01 ENCOUNTER — Ambulatory Visit (HOSPITAL_BASED_OUTPATIENT_CLINIC_OR_DEPARTMENT_OTHER)
Admission: RE | Admit: 2023-08-01 | Discharge: 2023-08-01 | Disposition: A | Discharge status: Home / Self Care | Payer: Medicare Other | Attending: Urology | Admitting: Urology

## 2023-08-01 ENCOUNTER — Encounter (HOSPITAL_BASED_OUTPATIENT_CLINIC_OR_DEPARTMENT_OTHER)
Admission: RE | Disposition: A | Discharge status: Home / Self Care | Payer: Self-pay | Source: Home / Self Care | Attending: Urology

## 2023-08-01 ENCOUNTER — Ambulatory Visit (HOSPITAL_BASED_OUTPATIENT_CLINIC_OR_DEPARTMENT_OTHER): Payer: Medicare Other | Admitting: Anesthesiology

## 2023-08-01 ENCOUNTER — Encounter (HOSPITAL_BASED_OUTPATIENT_CLINIC_OR_DEPARTMENT_OTHER): Payer: Self-pay | Admitting: Urology

## 2023-08-01 DIAGNOSIS — N3081 Other cystitis with hematuria: Secondary | ICD-10-CM | POA: Insufficient documentation

## 2023-08-01 DIAGNOSIS — N3080 Other cystitis without hematuria: Secondary | ICD-10-CM | POA: Diagnosis not present

## 2023-08-01 DIAGNOSIS — N401 Enlarged prostate with lower urinary tract symptoms: Secondary | ICD-10-CM | POA: Diagnosis not present

## 2023-08-01 DIAGNOSIS — N200 Calculus of kidney: Secondary | ICD-10-CM | POA: Diagnosis not present

## 2023-08-01 DIAGNOSIS — J449 Chronic obstructive pulmonary disease, unspecified: Secondary | ICD-10-CM | POA: Insufficient documentation

## 2023-08-01 DIAGNOSIS — F17200 Nicotine dependence, unspecified, uncomplicated: Secondary | ICD-10-CM | POA: Diagnosis not present

## 2023-08-01 DIAGNOSIS — R310 Gross hematuria: Secondary | ICD-10-CM | POA: Diagnosis present

## 2023-08-01 DIAGNOSIS — Z01818 Encounter for other preprocedural examination: Secondary | ICD-10-CM

## 2023-08-01 DIAGNOSIS — F17210 Nicotine dependence, cigarettes, uncomplicated: Secondary | ICD-10-CM | POA: Insufficient documentation

## 2023-08-01 DIAGNOSIS — Z8052 Family history of malignant neoplasm of bladder: Secondary | ICD-10-CM | POA: Insufficient documentation

## 2023-08-01 DIAGNOSIS — D494 Neoplasm of unspecified behavior of bladder: Secondary | ICD-10-CM | POA: Diagnosis not present

## 2023-08-01 DIAGNOSIS — Z7984 Long term (current) use of oral hypoglycemic drugs: Secondary | ICD-10-CM | POA: Insufficient documentation

## 2023-08-01 DIAGNOSIS — E119 Type 2 diabetes mellitus without complications: Secondary | ICD-10-CM | POA: Insufficient documentation

## 2023-08-01 DIAGNOSIS — N35912 Unspecified bulbous urethral stricture, male: Secondary | ICD-10-CM | POA: Insufficient documentation

## 2023-08-01 DIAGNOSIS — E039 Hypothyroidism, unspecified: Secondary | ICD-10-CM | POA: Insufficient documentation

## 2023-08-01 DIAGNOSIS — N210 Calculus in bladder: Secondary | ICD-10-CM | POA: Insufficient documentation

## 2023-08-01 HISTORY — DX: Alcohol abuse, uncomplicated: F10.10

## 2023-08-01 HISTORY — DX: Bladder disorder, unspecified: N32.9

## 2023-08-01 HISTORY — PX: CYSTOSCOPY WITH LITHOLAPAXY: SHX1425

## 2023-08-01 HISTORY — DX: Presence of dental prosthetic device (complete) (partial): Z97.2

## 2023-08-01 HISTORY — DX: Nicotine dependence, cigarettes, uncomplicated: F17.210

## 2023-08-01 HISTORY — DX: Hypothyroidism, unspecified: E03.9

## 2023-08-01 HISTORY — DX: Mixed hyperlipidemia: E78.2

## 2023-08-01 HISTORY — DX: Type 2 diabetes mellitus without complications: E11.9

## 2023-08-01 HISTORY — PX: CYSTOSCOPY WITH BIOPSY: SHX5122

## 2023-08-01 HISTORY — DX: Calculus in bladder: N21.0

## 2023-08-01 HISTORY — DX: Benign prostatic hyperplasia with lower urinary tract symptoms: N40.1

## 2023-08-01 HISTORY — DX: Deficiency of other specified B group vitamins: E53.8

## 2023-08-01 HISTORY — DX: Vitamin D deficiency, unspecified: E55.9

## 2023-08-01 HISTORY — DX: Presence of spectacles and contact lenses: Z97.3

## 2023-08-01 HISTORY — DX: Unspecified osteoarthritis, unspecified site: M19.90

## 2023-08-01 HISTORY — DX: Chronic obstructive pulmonary disease, unspecified: J44.9

## 2023-08-01 LAB — POCT I-STAT, CHEM 8
BUN: 10 mg/dL (ref 8–23)
Calcium, Ion: 1.31 mmol/L (ref 1.15–1.40)
Chloride: 101 mmol/L (ref 98–111)
Creatinine, Ser: 1 mg/dL (ref 0.61–1.24)
Glucose, Bld: 121 mg/dL — ABNORMAL HIGH (ref 70–99)
HCT: 50 % (ref 39.0–52.0)
Hemoglobin: 17 g/dL (ref 13.0–17.0)
Potassium: 4.4 mmol/L (ref 3.5–5.1)
Sodium: 140 mmol/L (ref 135–145)
TCO2: 25 mmol/L (ref 22–32)

## 2023-08-01 LAB — GLUCOSE, CAPILLARY: Glucose-Capillary: 143 mg/dL — ABNORMAL HIGH (ref 70–99)

## 2023-08-01 SURGERY — CYSTOSCOPY, WITH BIOPSY
Anesthesia: General | Site: Bladder

## 2023-08-01 MED ORDER — AMISULPRIDE (ANTIEMETIC) 5 MG/2ML IV SOLN: 10.0000 mg | Freq: Once | INTRAVENOUS | Status: DC | PRN

## 2023-08-01 MED ORDER — PROPOFOL 10 MG/ML IV BOLUS
INTRAVENOUS | Status: DC | PRN
  Administered 2023-08-01: 50 mg via INTRAVENOUS
  Administered 2023-08-01: 150 mg via INTRAVENOUS

## 2023-08-01 MED ORDER — ONDANSETRON HCL 4 MG/2ML IJ SOLN
INTRAMUSCULAR | Status: DC | PRN
  Administered 2023-08-01: 4 mg via INTRAVENOUS

## 2023-08-01 MED ORDER — CEFAZOLIN SODIUM-DEXTROSE 2-4 GM/100ML-% IV SOLN
2.0000 g | INTRAVENOUS | Status: AC
  Administered 2023-08-01: 2 g via INTRAVENOUS

## 2023-08-01 MED ORDER — DEXAMETHASONE SODIUM PHOSPHATE 4 MG/ML IJ SOLN
INTRAMUSCULAR | Status: DC | PRN
  Administered 2023-08-01: 5 mg via INTRAVENOUS

## 2023-08-01 MED ORDER — EPHEDRINE SULFATE (PRESSORS) 50 MG/ML IJ SOLN
INTRAMUSCULAR | Status: DC | PRN
  Administered 2023-08-01 (×2): 10 mg via INTRAVENOUS

## 2023-08-01 MED ORDER — DOCUSATE SODIUM 100 MG PO CAPS: 100.0000 mg | ORAL_CAPSULE | Freq: Every day | ORAL | 0 refills | Status: DC | PRN

## 2023-08-01 MED ORDER — OXYCODONE HCL 5 MG PO TABS: 5.0000 mg | ORAL_TABLET | Freq: Once | ORAL | Status: DC | PRN

## 2023-08-01 MED ORDER — FENTANYL CITRATE (PF) 100 MCG/2ML IJ SOLN
INTRAMUSCULAR | Status: AC
  Filled 2023-08-01: qty 2

## 2023-08-01 MED ORDER — OXYCODONE-ACETAMINOPHEN 5-325 MG PO TABS: 1.0000 | ORAL_TABLET | ORAL | 0 refills | Status: DC | PRN

## 2023-08-01 MED ORDER — ACETAMINOPHEN 500 MG PO TABS
ORAL_TABLET | ORAL | Status: AC
  Filled 2023-08-01: qty 2

## 2023-08-01 MED ORDER — STERILE WATER FOR IRRIGATION IR SOLN
Status: DC | PRN
  Administered 2023-08-01: 500 mL

## 2023-08-01 MED ORDER — SODIUM CHLORIDE 0.9 % IV SOLN: INTRAVENOUS | Status: DC | PRN

## 2023-08-01 MED ORDER — STERILE WATER FOR IRRIGATION IR SOLN
Status: DC | PRN
  Administered 2023-08-01: 3000 mL

## 2023-08-01 MED ORDER — SODIUM CHLORIDE 0.9 % IR SOLN
Status: DC | PRN
  Administered 2023-08-01 (×2): 3000 mL

## 2023-08-01 MED ORDER — ACETAMINOPHEN 500 MG PO TABS
1000.0000 mg | ORAL_TABLET | Freq: Once | ORAL | Status: AC
  Administered 2023-08-01: 1000 mg via ORAL

## 2023-08-01 MED ORDER — PROPOFOL 10 MG/ML IV BOLUS
INTRAVENOUS | Status: AC
  Filled 2023-08-01: qty 20

## 2023-08-01 MED ORDER — LIDOCAINE HCL (CARDIAC) PF 100 MG/5ML IV SOSY
PREFILLED_SYRINGE | INTRAVENOUS | Status: DC | PRN
  Administered 2023-08-01: 100 mg via INTRAVENOUS

## 2023-08-01 MED ORDER — FENTANYL CITRATE (PF) 100 MCG/2ML IJ SOLN: 25.0000 ug | INTRAMUSCULAR | Status: DC | PRN

## 2023-08-01 MED ORDER — LACTATED RINGERS IV SOLN: INTRAVENOUS | Status: DC

## 2023-08-01 MED ORDER — FENTANYL CITRATE (PF) 100 MCG/2ML IJ SOLN
INTRAMUSCULAR | Status: DC | PRN
  Administered 2023-08-01 (×2): 50 ug via INTRAVENOUS

## 2023-08-01 MED ORDER — CEFAZOLIN SODIUM-DEXTROSE 2-4 GM/100ML-% IV SOLN
INTRAVENOUS | Status: AC
  Filled 2023-08-01: qty 100

## 2023-08-01 MED ORDER — LIDOCAINE HCL (PF) 2 % IJ SOLN
INTRAMUSCULAR | Status: AC
  Filled 2023-08-01: qty 5

## 2023-08-01 MED ORDER — OXYCODONE HCL 5 MG/5ML PO SOLN: 5.0000 mg | Freq: Once | ORAL | Status: DC | PRN

## 2023-08-01 MED ORDER — ONDANSETRON HCL 4 MG/2ML IJ SOLN
INTRAMUSCULAR | Status: AC
  Filled 2023-08-01: qty 2

## 2023-08-01 SURGICAL SUPPLY — 31 items
BAG DRAIN URO-CYSTO SKYTR STRL (DRAIN) ×1 IMPLANT
BAG DRN RND TRDRP ANRFLXCHMBR (UROLOGICAL SUPPLIES) ×1
BAG DRN UROCATH (DRAIN) ×1
BAG URINE DRAIN 2000ML AR STRL (UROLOGICAL SUPPLIES) IMPLANT
CATH FOLEY 2WAY SLVR 5CC 18FR (CATHETERS) IMPLANT
CLOTH BEACON ORANGE TIMEOUT ST (SAFETY) ×1 IMPLANT
COVER DOME SNAP 22 D (MISCELLANEOUS) IMPLANT
ELECT REM PT RETURN 9FT ADLT (ELECTROSURGICAL) ×1
ELECTRODE REM PT RTRN 9FT ADLT (ELECTROSURGICAL) ×1 IMPLANT
GLOVE BIO SURGEON STRL SZ7 (GLOVE) ×1 IMPLANT
GLOVE BIO SURGEON STRL SZ7.5 (GLOVE) ×1 IMPLANT
GOWN STRL REUS W/TWL LRG LVL3 (GOWN DISPOSABLE) ×1 IMPLANT
HOLDER FOLEY CATH W/STRAP (MISCELLANEOUS) IMPLANT
IV NS 1000ML (IV SOLUTION)
IV NS 1000ML BAXH (IV SOLUTION) ×1 IMPLANT
IV NS IRRIG 3000ML ARTHROMATIC (IV SOLUTION) ×1 IMPLANT
KIT TURNOVER CYSTO (KITS) ×1 IMPLANT
LASER FIB FLEXIVA PULSE ID 550 (Laser) IMPLANT
LASER FIB FLEXIVA PULSE ID 910 (Laser) IMPLANT
MANIFOLD NEPTUNE II (INSTRUMENTS) ×1 IMPLANT
NS IRRIG 1000ML POUR BTL (IV SOLUTION) IMPLANT
NS IRRIG 500ML POUR BTL (IV SOLUTION) ×1 IMPLANT
PACK CYSTO (CUSTOM PROCEDURE TRAY) ×1 IMPLANT
SLEEVE SCD COMPRESS KNEE MED (STOCKING) ×1 IMPLANT
SYR 10ML LL (SYRINGE) ×1 IMPLANT
SYR TOOMEY IRRIG 70ML (MISCELLANEOUS) ×1
SYRINGE TOOMEY IRRIG 70ML (MISCELLANEOUS) IMPLANT
TUBE CONNECTING 12X1/4 (SUCTIONS) ×1 IMPLANT
TUBING UROLOGY SET (TUBING) ×1 IMPLANT
WATER STERILE IRR 3000ML UROMA (IV SOLUTION) ×1 IMPLANT
WATER STERILE IRR 500ML POUR (IV SOLUTION) IMPLANT

## 2023-08-01 NOTE — Anesthesia Postprocedure Evaluation (Signed)
Anesthesia Post Note  Patient: Leonard Walton  Procedure(s) Performed: CYSTOSCOPY WITH BLADDER BIOPSY, URETHERAl DILATION AND FULGURATION OF PROSTATE (Bladder) CYSTOLITHOLAPAXY (Bladder)     Patient location during evaluation: PACU Anesthesia Type: General Level of consciousness: awake Pain management: pain level controlled Vital Signs Assessment: post-procedure vital signs reviewed and stable Respiratory status: spontaneous breathing, nonlabored ventilation and respiratory function stable Cardiovascular status: blood pressure returned to baseline and stable Postop Assessment: no apparent nausea or vomiting Anesthetic complications: no   No notable events documented.  Last Vitals:  Vitals:   08/01/23 1345 08/01/23 1433  BP: 126/64 131/79  Pulse: (!) 59 (!) 58  Resp: 16 18  Temp:  36.5 C  SpO2: 91% 95%    Last Pain:  Vitals:   08/01/23 1433  TempSrc: Oral  PainSc: 0-No pain                 Linton Rump

## 2023-08-01 NOTE — Anesthesia Preprocedure Evaluation (Addendum)
Anesthesia Evaluation  Patient identified by MRN, date of birth, ID band Patient awake    Reviewed: Allergy & Precautions, NPO status , Patient's Chart, lab work & pertinent test results  History of Anesthesia Complications Negative for: history of anesthetic complications  Airway Mallampati: III  TM Distance: >3 FB Neck ROM: Full    Dental  (+) Dental Advisory Given, Upper Dentures   Pulmonary neg shortness of breath, neg sleep apnea, COPD, neg recent URI, Current Smoker and Patient abstained from smoking. Non-small cell lung cancer of RLL   Pulmonary exam normal breath sounds clear to auscultation       Cardiovascular (-) hypertension(-) angina (-) Past MI, (-) Cardiac Stents and (-) CABG (-) dysrhythmias  Rhythm:Regular Rate:Normal  HLD   Neuro/Psych neg Seizures PSYCHIATRIC DISORDERS  Depression    negative neurological ROS     GI/Hepatic negative GI ROS,,,(+)     substance abuse  alcohol use  Endo/Other  diabetes, Type 2, Oral Hypoglycemic AgentsHypothyroidism    Renal/GU negative Renal ROS   Bladder lesion    Musculoskeletal  (+) Arthritis , Osteoarthritis,    Abdominal  (+) + obese  Peds  Hematology negative hematology ROS (+) Lab Results      Component                Value               Date                      WBC                      8.5                 05/04/2023                HGB                      16.3                05/04/2023                HCT                      48.2                05/04/2023                MCV                      101.5 (H)           05/04/2023                PLT                      178                 05/04/2023              Anesthesia Other Findings   Reproductive/Obstetrics                             Anesthesia Physical Anesthesia Plan  ASA: 3  Anesthesia Plan: General   Post-op Pain Management: Tylenol PO (pre-op)*   Induction:  Intravenous  PONV Risk Score and Plan: 1 and Ondansetron, Dexamethasone  and Treatment may vary due to age or medical condition  Airway Management Planned: LMA  Additional Equipment:   Intra-op Plan:   Post-operative Plan: Extubation in OR  Informed Consent: I have reviewed the patients History and Physical, chart, labs and discussed the procedure including the risks, benefits and alternatives for the proposed anesthesia with the patient or authorized representative who has indicated his/her understanding and acceptance.     Dental advisory given  Plan Discussed with: CRNA and Anesthesiologist  Anesthesia Plan Comments: (Risks of general anesthesia discussed including, but not limited to, sore throat, hoarse voice, chipped/damaged teeth, injury to vocal cords, nausea and vomiting, allergic reactions, lung infection, heart attack, stroke, and death. All questions answered. )        Anesthesia Quick Evaluation

## 2023-08-01 NOTE — Transfer of Care (Signed)
Immediate Anesthesia Transfer of Care Note  Patient: Leonard Walton  Procedure(s) Performed: Procedure(s) (LRB): CYSTOSCOPY WITH BLADDER BIOPSY, URETHERAl DILATION AND FULGURATION OF PROSTATE (N/A) CYSTOLITHOLAPAXY (N/A)  Patient Location: PACU  Anesthesia Type: GA  Level of Consciousness: awake, sedated, patient cooperative and responds to stimulation  Airway & Oxygen Therapy: Patient Spontanous Breathing and Patient connected to [place] oxygen  Post-op Assessment: Report given to PACU RN, Post -op Vital signs reviewed and stable and Patient moving all extremities  Post vital signs: Reviewed and stable  Complications: No apparent anesthesia complications

## 2023-08-01 NOTE — Progress Notes (Signed)
DC Teaching / Review of AVS in phase II PACU. AVS copy provided to client. Extensive teaching done in regards to urinary catheter to remain in place until Thursday am per attending MD today. Explanation of maintenance, emptying and how to properly remove urinary catheter on Thursday am. Client also informed on when to call MD office if issues arise, I.e. fever, unable to void, pain not well controlled. MD office number provided. Along with safety measures while taking opioids while at home. Opportunity for questions provided multiple times prior to DC from [facility].

## 2023-08-01 NOTE — Anesthesia Procedure Notes (Signed)
Procedure Name: LMA Insertion Date/Time: 08/01/2023 12:10 PM  Performed by: Jessica Priest, CRNAPre-anesthesia Checklist: Patient identified, Emergency Drugs available, Suction available, Patient being monitored and Timeout performed Patient Re-evaluated:Patient Re-evaluated prior to induction Oxygen Delivery Method: Circle system utilized Preoxygenation: Pre-oxygenation with 100% oxygen Induction Type: IV induction Ventilation: Mask ventilation without difficulty LMA: LMA inserted LMA Size: 5.0 Number of attempts: 1 Airway Equipment and Method: Bite block Placement Confirmation: positive ETCO2, breath sounds checked- equal and bilateral and CO2 detector Tube secured with: Tape Dental Injury: Teeth and Oropharynx as per pre-operative assessment

## 2023-08-01 NOTE — Discharge Instructions (Addendum)
Activity:  You are encouraged to ambulate frequently (about every hour during waking hours) to help prevent blood clots from forming in your legs or lungs.    Diet: You should advance your diet as instructed by your physician.  It will be normal to have some bloating, nausea, and abdominal discomfort intermittently.  Prescriptions:  You will be provided a prescription for pain medication to take as needed.  If your pain is not severe enough to require the prescription pain medication, you may take extra strength Tylenol instead which will have less side effects.  You should also take a prescribed stool softener to avoid straining with bowel movements as the prescription pain medication may constipate you.  What to call us about: You should call the office [phone]) if you develop fever > 101 or develop persistent vomiting. Activity:  You are encouraged to ambulate frequently (about every hour during waking hours) to help prevent blood clots from forming in your legs or lungs.    You have a foley catheter in place. Remove foley at home with 10ml provided syringe.     Foley catheter may be removed Thursday 08/04/2023.    No acetaminophen/Tylenol until after 4:30 pm today if needed.     Post Anesthesia Home Care Instructions  Activity: Get plenty of rest for the remainder of the day. A responsible individual must stay with you for 24 hours following the procedure.  For the next 24 hours, DO NOT: -Drive a car -Advertising copywriter -Drink alcoholic beverages -Take any medication unless instructed by your physician -Make any legal decisions or sign important papers.  Meals: Start with liquid foods such as gelatin or soup. Progress to regular foods as tolerated. Avoid greasy, spicy, heavy foods. If nausea and/or vomiting occur, drink only clear liquids until the nausea and/or vomiting subsides. Call your physician if vomiting continues.  Special Instructions/Symptoms: Your throat may  feel dry or sore from the anesthesia or the breathing tube placed in your throat during surgery. If this causes discomfort, gargle with warm salt water. The discomfort should disappear within 24 hours.

## 2023-08-01 NOTE — Op Note (Signed)
Operative Note  Preoperative diagnosis:  1.  Bladder stone 2. Bladder lesion  Postoperative diagnosis: 1.  Bulbar urethral stricture 2. BPH 3. Bladder stone 4. Bladder lesion  Procedure(s): 1.  Cystoscopy 2.  Urethral dilation 3.  Cystolitholopaxy of large bladder stone greater than 2 cm 4.  Bladder biopsy with fulguration 5.  Fulguration of prostate  Surgeon: Jettie Pagan, MD  Assistants:  None  Anesthesia:  General  Complications:  None  EBL:  Minimal  Specimens: 1.  ID Type Source Tests Collected by Time Destination  1 : Posterior bladder wall lesion Tissue PATH GU biopsy SURGICAL PATHOLOGY Jannifer Hick, MD 08/01/2023 1241    Drains/Catheters: 1.  18 French catheter  Intraoperative findings:   Approximately 3 cm length bulbar urethral stricture approximately 18 [place], dilated to 24 [place].  Bilobar obstructing prostate with some friability of the prostate Approximate 2 cm bladder stone and jack stone configuration, successfully fragmented and removed 1 cm bladder lesion posterior aspect that was biopsied and fulgurated with excellent hemostasis Excellent hemostasis of prostate with fulguration of prostate  Indication:  Leonard Walton is a 72 y.o. male with history of bladder stone and bladder lesion here for cystoscopy litholapaxy and bladder biopsy.  Description of procedure: After informed consent was obtained from the patient, the patient was identified and taken to the operating room and placed in the supine position.  General anesthesia was administered as well as perioperative IV antibiotics.  At the beginning of the case, a time-out was performed to properly identify the patient, the surgery to be performed, and the surgical site.  Sequential compression devices were applied to the lower extremities at the beginning of the case for DVT prophylaxis.  The patient was then placed in the dorsal lithotomy supine position, prepped and draped in sterile  fashion.  I then passed the 21-French rigid cystoscope through the urethra where encountered proximal 18 French 3 cm bulbar urethral stricture.  This was dilated to 52 [place] successfully the dilator and scope.  I encountered bilateral retention prostate with some friability.  I into the bladder encountered 2 cm jack stone in the bladder.  There was a small area of erythema in the posterior bladder wall that appeared to be inflammatory in nature.  Using 1000 m holmium laser fiber, the stone was fragmented in its entirety and was retrieved through the scope.  I made sure to remove all stones.  I then performed cold cup biopsy of the bladder lesion on the posterior aspect of the bladder and submitted this office specimen.  Next, this area was fulgurated the [place].  There was some bleeding from the prostate laterally on the right lateral lobe and this was also fulgurated.  Following this, excellent hemostasis was achieved.  All stones were removed.  Bladder was emptied.  I have placed 18 French Foley catheter and this drained clear yellow urine. The patient was subsequently awoken and returned to PACU stable condition.  Plan: He may remove Foley catheter home in 3 days with provide instructions given history of bulbar urethral stricture that was dilated.  Matt R. Concepcion Kirkpatrick MD [facility]  Pager: [phone]

## 2023-08-01 NOTE — H&P (Signed)
Office Visit Report     06/09/2023   --------------------------------------------------------------------------------   Leonard Walton  MRN: [id number]  DOB: 08/29/1951, 72 year old Male  SSN:    PRIMARY CARE:  Mila Palmer, MD  PRIMARY CARE FAX:  [id number]  REFERRING:  Jannifer Hick, MD  PROVIDER:  Jettie Pagan, M.D.  LOCATION:  [facility] [phone]     --------------------------------------------------------------------------------   CC/HPI: Leonard Walton is a 72 year old male who is seen in follow-up today for gross hematuria, questionable bladder lesion, bladder stone, BPH with LUTS, elevated PSA.   1. Bladder lesion:  -He developed asymptomatic gross hematuria in January 2024. He does have significant smoking history smoking at least 1/2 pack/day for over 50 years. CT A/P with and without contrast ordered by his PCP in 10/2022 revealed an irregular jack stone in the bladder measuring 2 cm as well as bladder diverticuli as well as thickening of the wall of the abdominal bladder diverticulum with possible underlying neoplasm difficult to exclude.  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 Unable to exclude CIS. No evidence of papillary. Lesion.  -He thinks his brother has a history of bladder cancer.   #2. Bladder stone: CT A/P without contrast 2/24 with 2 x 2 cm jack stone of the bladder. He denies prior treatments of bladder stones. He has no significant bothersome pelvic pain.   3. BPH/LUTS: He reports a very weak flow stream with sensation, bladder emptying. He remains on tamsulosin 0.4 mg he is taking for several years. CT A/P 10/2022 reveals prostate volume 130 cc. Cystoscopy 06/09/2023 with bilateral obstructing prostate.   #4. Elevated PSA: He thinks his brother has prostate cancer. PSA in 02/2023 is elevated 5.8. Patient denies prior history of an elevated PSA and denies  prior TRUS biopsy. He has not not had a history of prostatitis or urinary tract infections.   #5. Urolithiasis: CT A/P 2/24 with 3 mm left renal stone in the lower pole. No other ureteral stones or hydronephrosis.   Patient currently denies fever, chills, sweats, nausea, vomiting, abdominal or flank pain, gross hematuria or dysuria.   He has a past medical history of type 2 diabetes, hypothyroidism, major depression, and daily alcohol use.     ALLERGIES: None   MEDICATIONS: Aspirin  Crestor  Metformin Hcl  Cefdinir 300 mg capsule 1 capsule PO BID  Flomax 0.4 mg capsule  Synthroid  Vitamin B-1  Vitamin D3     GU PSH: No GU PSH    NON-GU PSH: Back Surgery (Unspecified) Knee Arthroscopy Visit Complexity (formerly [facility]PC1X) - 06/07/2023     GU PMH: Bladder Stone - 06/07/2023 Bladder tumor/neoplasm - 06/07/2023 BPH w/LUTS - 06/07/2023, Severe urinary sx's despite being on tamsulosin for around a year (this does help his baseline sx's, however). DRE difficult due to length of anal canal but he does seem to have an enlarged gland. PVR today 84 mL. I think it would be appropriate to continue with efforts at medical management before discussing surgical interventions. , - 2021 Gross hematuria - 06/07/2023 Weak Urinary Stream - 06/07/2023, - 2021    NON-GU PMH: Arthritis Diabetes Type 2 Hypercholesterolemia Hyperthyroidism Sleep Apnea    FAMILY HISTORY: Prostate Cancer - Brother   SOCIAL HISTORY: Marital Status: Divorced Preferred Language: English; Ethnicity: Not Hispanic Or Latino; Race: White Current Smoking Status: Patient smokes. Has smoked since 01/18/1970. Smokes 1/2 pack per  day.   Tobacco Use Assessment Completed: Used Tobacco in last 30 days? Drinks 3 drinks per day.  Drinks 1 caffeinated drink per day. Patient's occupation is/was Retired.    REVIEW OF SYSTEMS:    GU Review Male:   Patient denies frequent urination, hard to postpone urination, burning/ pain with  urination, get up at night to urinate, leakage of urine, stream starts and stops, trouble starting your stream, have to strain to urinate , erection problems, and penile pain.  Gastrointestinal (Upper):   Patient denies nausea, vomiting, and indigestion/ heartburn.  Gastrointestinal (Lower):   Patient denies diarrhea and constipation.  Constitutional:   Patient denies fever, night sweats, weight loss, and fatigue.  Skin:   Patient denies skin rash/ lesion and itching.  Eyes:   Patient denies blurred vision and double vision.  Ears/ Nose/ Throat:   Patient denies sore throat and sinus problems.  Hematologic/Lymphatic:   Patient denies swollen glands and easy bruising.  Cardiovascular:   Patient denies leg swelling and chest pains.  Respiratory:   Patient denies cough and shortness of breath.  Endocrine:   Patient denies excessive thirst.  Musculoskeletal:   Patient denies back pain and joint pain.  Neurological:   Patient denies headaches and dizziness.  Psychologic:   Patient denies depression and anxiety.   VITAL SIGNS: None   MULTI-SYSTEM PHYSICAL EXAMINATION:    Constitutional: Well-nourished. No physical deformities. Normally developed. Good grooming.  Respiratory: No labored breathing, no use of accessory muscles.   Cardiovascular: Normal temperature, normal extremity pulses, no swelling, no varicosities.  Gastrointestinal: No mass, no tenderness, no rigidity, non obese abdomen.     Complexity of Data:  Source Of History:  Patient, Medical Record Summary  Lab Test Review:   PSA  Records Review:   Previous Doctor Records, Previous Patient Records  Urine Test Review:   Urinalysis  X-Ray Review: C.T. Hematuria: Reviewed Films. Reviewed Report. Discussed With Patient.     01/26/21 12/26/19 10/31/18  PSA  Total PSA 5.77 ng/ml 4.07 ng/ml 3.84 ng/ml    PROCEDURES:         Flexible Cystoscopy - 52000  Risks, benefits, and some of the potential complications of the procedure were  discussed at length with the patient including infection, bleeding, voiding discomfort, urinary retention, fever, chills, sepsis, and others. All questions were answered. Informed consent was obtained. Antibiotic prophylaxis was given. Sterile technique and intraurethral analgesia were used.  Meatus:  Normal size. Normal location. Normal condition.  Urethra:  No strictures.  External Sphincter:  Normal.  Verumontanum:  Normal.  Prostate:  Obstructing. Severe hyperplasia.  Bladder Neck:  Non-obstructing.  Ureteral Orifices:  Normal location. Normal size. Normal shape. Effluxed clear urine.  Bladder:  Unable toSevere bladder trabeculation. 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 No evidence of papillary appearing lesions.      The lower urinary tract was carefully examined. The procedure was well-tolerated and without complications. Antibiotic instructions were given. Instructions were given to call the office immediately for bloody urine, difficulty urinating, urinary retention, painful or frequent urination, fever, chills, nausea, vomiting or other illness. The patient stated that he understood these instructions and would comply with them.         Visit Complexity - G2211          Urinalysis w/Scope Dipstick Dipstick Cont'd Micro  Color: Amber Bilirubin: Neg mg/dL WBC/hpf: 10 - 63/KZS  Appearance: Slightly Cloudy Ketones: Neg mg/dL  RBC/hpf: 0 - 2/hpf  Specific Gravity: 1.025 Blood: 2+ ery/uL Bacteria: Mod (26-50/hpf)  pH: <=5.0 Protein: Trace mg/dL Cystals: NS (Not Seen)  Glucose: Neg mg/dL Urobilinogen: 0.2 mg/dL Casts: NS (Not Seen)    Nitrites: Neg Trichomonas: Not Present    Leukocyte Esterase: 1+ leu/uL Mucous: Present      Epithelial Cells: 0 - 5/hpf      Yeast: NS (Not Seen)      Sperm: Not Present    ASSESSMENT:      ICD-10 Details  1 GU:   Bladder Stone - N21.0   2   Bladder tumor/neoplasm - 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 Bladder lesion:  -I recommend proceeding the operating room with bladder biopsy, possible TURBT. Discussed risk and benefits including risk of infection, bleeding, bladder perforation.   2. Bladder stone: Recommend concomitant cystolitholopaxy. Discussed risk of infection, bleeding, persistence.   #3. BPH/LUTS: Continue tamsulosin. We discussed options of bladder outlet obstruction procedure. He would like to treat bladder stone and undergo bladder lesion workup as above.   4. Elevated PSA: Reviewed his PSA 5.8 with prostate volume 130 according to PSA density is 0.04. We discussed that we generally screening men age 8-70 and that men in both 15s although still have a chance of prostate cancer, typically there are many cases that are not clinically significant. We discussed options including TRUS biopsy, proceed with prostate MRI or periodic surveillance. At this time, he would like to continue with PSA surveillance.   CC: Mila Palmer, MD     Urology Preoperative H&P   Chief Complaint: Bladder stone and bladder lesion  History of Present Illness: Leonard Walton is a 72 y.o. male with bladder stone and bladder lesion here for cystolitholapaxy and bladder biopsy vs TURBT. Denies fevers, chills, dysuria.    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   OA (osteoarthritis)    knees/ hands/ shoulders/ elbows/  hips   Type 2 diabetes mellitus (HCC)    followed by pcp    (07-27-2023  pt stated checks blood sugar daily am fasting,  average blood sugar 100--120s)   Vitamin D deficiency    Wears dentures    upper full   Wears glasses     Past Surgical History:  Procedure Laterality Date   BRONCHIAL BIOPSY  06/06/2023   Procedure: BRONCHIAL BIOPSIES;  Surgeon: Leslye Peer, MD;  Location: [facility] ENDOSCOPY;  Service: Pulmonary;;   BRONCHIAL BRUSHINGS  06/06/2023   Procedure: BRONCHIAL BRUSHINGS;  Surgeon: Leslye Peer, MD;  Location: [facility] ENDOSCOPY;  Service: Pulmonary;;   BRONCHIAL NEEDLE ASPIRATION BIOPSY  06/06/2023   Procedure: BRONCHIAL NEEDLE ASPIRATION BIOPSIES;  Surgeon: Leslye Peer, MD;  Location: [facility] ENDOSCOPY;  Service: Pulmonary;;   COLONOSCOPY  2023   FIDUCIAL MARKER PLACEMENT  06/06/2023   Procedure: FIDUCIAL MARKER PLACEMENT;  Surgeon: Leslye Peer, MD;  Location[facility] ENDOSCOPY;  Service: Pulmonary;;   KNEE SURGERY Right    1966  and 1970s   LUMBAR LAMINECTOMY  10/19/2007   @[facility]  by  Dr. Gerlene Fee;  L3--5   TONSILLECTOMY  1962   UPPER GI ENDOSCOPY  2015    Allergies:  Allergies  Allergen Reactions   Finasteride Other (See Comments)    dizziness    Family History  Problem Relation Age of Onset   CAD Mother        MI at age 33   Heart attack Mother    Lung cancer Father    Throat cancer Sister    Prostate cancer Brother    Ovarian cancer Sister     Social History:  reports that he has been smoking cigarettes. He started smoking about 56 years ago. He has never used smokeless tobacco. He reports current alcohol use of about 10.0 - 14.0 standard drinks of alcohol per week. He reports that he does not use drugs.  ROS: A complete review of systems was  performed.  All systems are negative except for pertinent findings as noted.  Physical Exam:  Vital signs in last 24 hours: Temp:  [97.4 F (36.3 C)] 97.4 F (36.3 C) (11/11 1024) Pulse Rate:  [56] 56 (11/11 1024) Resp:  [18] 18 (11/11 1024) BP: (155)/(73) 155/73 (11/11 1024) SpO2:  [97 %] 97 % (11/11 1024) Weight:  [91.1 kg] 91.1 kg (11/11 1024) Constitutional:  Alert and oriented, No acute distress Cardiovascular: Regular rate and rhythm Respiratory: Normal respiratory effort, Lungs clear bilaterally GI: Abdomen is soft, nontender, nondistended, no abdominal masses GU: No CVA tenderness Lymphatic: No lymphadenopathy Neurologic: Grossly intact, no focal deficits Psychiatric: Normal mood and affect  Laboratory Data:  Recent Labs    08/01/23 1044  HGB 17.0  HCT 50.0    Recent Labs    08/01/23 1044  NA 140  K 4.4  CL 101  GLUCOSE 121*  BUN 10  CREATININE 1.00     Results for orders placed or performed during the hospital encounter of 08/01/23 (from the past 24 hour(s))  I-STAT, chem 8     Status: Abnormal   Collection Time: 08/01/23 10:44 AM  Result Value Ref Range   Sodium 140 135 - 145 mmol/L   Potassium 4.4 3.5 - 5.1 mmol/L   Chloride 101 98 - 111 mmol/L   BUN 10 8 - 23 mg/dL   Creatinine, Ser 7.82 0.61 - 1.24 mg/dL   Glucose, Bld 956 (H) 70 - 99 mg/dL   Calcium, Ion 2.13 0.86 - 1.40 mmol/L   TCO2 25 22 - 32 mmol/L   Hemoglobin 17.0 13.0 - 17.0 g/dL   HCT 57.8 46.9 - 62.9 %   No results found for this or any previous visit (from the past 240 hour(s)).  Renal Function: Recent Labs    08/01/23 1044  CREATININE 1.00   Estimated Creatinine Clearance: 69.8 mL/min (by C-G formula based on SCr of 1 mg/dL).  Radiologic Imaging: No results found.  I independently reviewed the above imaging studies.  Assessment and  Plan Leonard Walton is a 72 y.o. male with bladder stone and bladder lesion here for cystolitholapaxy and bladder biopsy vs TURBT.  Leonard R.  Kamarah Bilotta MD 08/01/2023, 11:15 AM  [facility] Pager: [phone]): [phone]

## 2023-08-02 LAB — SURGICAL PATHOLOGY

## 2023-08-03 ENCOUNTER — Encounter (HOSPITAL_BASED_OUTPATIENT_CLINIC_OR_DEPARTMENT_OTHER): Payer: Self-pay | Admitting: Urology

## 2023-08-03 NOTE — Pre-Procedure Instructions (Signed)
Surgical Instructions   Your procedure is scheduled on August 08, 2023. Report to [facility] Main Entrance "A" at 5:30 A.M., then check in with the Admitting office. Any questions or running late day of surgery: call [phone]  Questions prior to your surgery date: call [phone], Monday-Friday, 8am-4pm. If you experience any cold or flu symptoms such as cough, fever, chills, shortness of breath, etc. between now and your scheduled surgery, please notify us at the above number.     Remember:  Do not eat or drink after midnight the night before your surgery    Take these medicines the morning of surgery with A SIP OF WATER: Budeson-Glycopyrrol-Formoterol (BREZTRI AEROSPHERE) inhaler docusate sodium (COLACE)  levothyroxine (SYNTHROID)  rosuvastatin (CRESTOR)  tamsulosin [facility])    May take these medicines IF NEEDED: albuterol (VENTOLIN HFA) inhaler  oxyCODONE-acetaminophen (PERCOCET    Continue taking your Aspirin through the day before surgery. DO NOT take any the morning of surgery.   One week prior to surgery, STOP taking any Aleve, Naproxen, Ibuprofen, Motrin, Advil, Goody's, BC's, all herbal medications, fish oil, and non-prescription vitamins.   WHAT DO I DO ABOUT MY DIABETES MEDICATION?   STOP taking your metFORMIN (GLUCOPHAGE) two days prior to surgery. Your last dose will be November 15th.   HOW TO MANAGE YOUR DIABETES BEFORE AND AFTER SURGERY  Why is it important to control my blood sugar before and after surgery? Improving blood sugar levels before and after surgery helps healing and can limit problems. A way of improving blood sugar control is eating a healthy diet by:  Eating less sugar and carbohydrates  Increasing activity/exercise  Talking with your doctor about reaching your blood sugar goals High blood sugars (greater than 180 mg/dL) can raise your risk of infections and slow your recovery, so you will need to focus on controlling your  diabetes during the weeks before surgery. Make sure that the doctor who takes care of your diabetes knows about your planned surgery including the date and location.  How do I manage my blood sugar before surgery? Check your blood sugar at least 4 times a day, starting 2 days before surgery, to make sure that the level is not too high or low.  Check your blood sugar the morning of your surgery when you wake up and every 2 hours until you get to the Short Stay unit.  If your blood sugar is less than 70 mg/dL, you will need to treat for low blood sugar: Do not take insulin. Treat a low blood sugar (less than 70 mg/dL) with  cup of clear juice (cranberry or apple), 4 glucose tablets, OR glucose gel. Recheck blood sugar in 15 minutes after treatment (to make sure it is greater than 70 mg/dL). If your blood sugar is not greater than 70 mg/dL on recheck, call [phone] for further instructions. Report your blood sugar to the short stay nurse when you get to Short Stay.  If you are admitted to the hospital after surgery: Your blood sugar will be checked by the staff and you will probably be given insulin after surgery (instead of oral diabetes medicines) to make sure you have good blood sugar levels. The goal for blood sugar control after surgery is 80-180 mg/dL.                      Do NOT Smoke (Tobacco/Vaping) for 24 hours prior to your procedure.  If you use a CPAP at night, you may bring  your mask/headgear for your overnight stay.   You will be asked to remove any contacts, glasses, piercing's, hearing aid's, dentures/partials prior to surgery. Please bring cases for these items if needed.    Patients discharged the day of surgery will not be allowed to drive home, and someone needs to stay with them for 24 hours.  SURGICAL WAITING ROOM VISITATION Patients may have no more than 2 support people in the waiting area - these visitors may rotate.   Pre-op nurse will coordinate an  appropriate time for 1 ADULT support person, who may not rotate, to accompany patient in pre-op.  Children under the age of 49 must have an adult with them who is not the patient and must remain in the main waiting area with an adult.  If the patient needs to stay at the hospital during part of their recovery, the visitor guidelines for inpatient rooms apply.  Please refer to the [facility] website for the visitor guidelines for any additional information.   If you received a COVID test during your pre-op visit  it is requested that you wear a mask when out in public, stay away from anyone that may not be feeling well and notify your surgeon if you develop symptoms. If you have been in contact with anyone that has tested positive in the last 10 days please notify you surgeon.      Pre-operative CHG Bathing Instructions   You can play a key role in reducing the risk of infection after surgery. Your skin needs to be as free of germs as possible. You can reduce the number of germs on your skin by washing with CHG (chlorhexidine gluconate) soap before surgery. CHG is an antiseptic soap that kills germs and continues to kill germs even after washing.   DO NOT use if you have an allergy to chlorhexidine/CHG or antibacterial soaps. If your skin becomes reddened or irritated, stop using the CHG and notify one of our RNs at [phone].              TAKE A SHOWER THE NIGHT BEFORE SURGERY AND THE DAY OF SURGERY    Please keep in mind the following:  DO NOT shave, including legs and underarms, 48 hours prior to surgery.   You may shave your face before/day of surgery.  Place clean sheets on your bed the night before surgery Use a clean washcloth (not used since being washed) for each shower. DO NOT sleep with pet's night before surgery.  CHG Shower Instructions:  Wash your face and private area with normal soap. If you choose to wash your hair, wash first with your normal shampoo.  After you use  shampoo/soap, rinse your hair and body thoroughly to remove shampoo/soap residue.  Turn the water OFF and apply half the bottle of CHG soap to a CLEAN washcloth.  Apply CHG soap ONLY FROM YOUR NECK DOWN TO YOUR TOES (washing for 3-5 minutes)  DO NOT use CHG soap on face, private areas, open wounds, or sores.  Pay special attention to the area where your surgery is being performed.  If you are having back surgery, having someone wash your back for you may be helpful. Wait 2 minutes after CHG soap is applied, then you may rinse off the CHG soap.  Pat dry with a clean towel  Put on clean pajamas    Additional instructions for the day of surgery: DO NOT APPLY any lotions, deodorants, cologne, or perfumes.   Do not  wear jewelry or makeup Do not wear nail polish, gel polish, artificial nails, or any other type of covering on natural nails (fingers and toes) Do not bring valuables to the hospital. [facility] is not responsible for valuables/personal belongings. Put on clean/comfortable clothes.  Please brush your teeth.  Ask your nurse before applying any prescription medications to the skin.

## 2023-08-04 ENCOUNTER — Ambulatory Visit (HOSPITAL_COMMUNITY)
Admission: RE | Admit: 2023-08-04 | Discharge: 2023-08-04 | Disposition: A | Discharge status: Home / Self Care | Payer: Medicare Other | Source: Ambulatory Visit | Attending: Thoracic Surgery (Cardiothoracic Vascular Surgery) | Admitting: Thoracic Surgery (Cardiothoracic Vascular Surgery)

## 2023-08-04 ENCOUNTER — Encounter (HOSPITAL_COMMUNITY): Payer: Self-pay

## 2023-08-04 ENCOUNTER — Other Ambulatory Visit: Payer: Self-pay

## 2023-08-04 ENCOUNTER — Encounter (HOSPITAL_COMMUNITY)
Admission: RE | Admit: 2023-08-04 | Discharge: 2023-08-04 | Disposition: A | Discharge status: Home / Self Care | Payer: Medicare Other | Source: Ambulatory Visit | Attending: Thoracic Surgery (Cardiothoracic Vascular Surgery) | Admitting: Thoracic Surgery (Cardiothoracic Vascular Surgery)

## 2023-08-04 VITALS — BP 135/89 | HR 54 | Temp 98.2°F | Resp 18 | Ht 67.0 in | Wt 198.1 lb

## 2023-08-04 DIAGNOSIS — Z1152 Encounter for screening for COVID-19: Secondary | ICD-10-CM | POA: Insufficient documentation

## 2023-08-04 DIAGNOSIS — Z902 Acquired absence of lung [part of]: Secondary | ICD-10-CM | POA: Diagnosis not present

## 2023-08-04 DIAGNOSIS — E119 Type 2 diabetes mellitus without complications: Secondary | ICD-10-CM | POA: Diagnosis not present

## 2023-08-04 DIAGNOSIS — C3431 Malignant neoplasm of lower lobe, right bronchus or lung: Secondary | ICD-10-CM | POA: Diagnosis not present

## 2023-08-04 DIAGNOSIS — Z01818 Encounter for other preprocedural examination: Secondary | ICD-10-CM | POA: Insufficient documentation

## 2023-08-04 LAB — HEMOGLOBIN A1C
Hgb A1c MFr Bld: 6.6 % — ABNORMAL HIGH (ref 4.8–5.6)
Mean Plasma Glucose: 142.72 mg/dL

## 2023-08-04 LAB — URINALYSIS, MICROSCOPIC (REFLEX): RBC / HPF: >50 RBC/hpf (ref 0–5)

## 2023-08-04 LAB — BLOOD GAS, ARTERIAL
Acid-Base Excess: 1.3 mmol/L (ref 0.0–2.0)
Bicarbonate: 25 mmol/L (ref 20.0–28.0)
Drawn by: 58793
O2 Saturation: 97.5 %
Patient temperature: 37
pCO2 arterial: 36 mm[Hg] (ref 32–48)
pH, Arterial: 7.45 (ref 7.35–7.45)
pO2, Arterial: 73 mm[Hg] — ABNORMAL LOW (ref 83–108)

## 2023-08-04 LAB — URINALYSIS, ROUTINE W REFLEX MICROSCOPIC
Glucose, UA: NEGATIVE mg/dL
Ketones, ur: NEGATIVE mg/dL
Nitrite: NEGATIVE
Protein, ur: 100 mg/dL — AB
Specific Gravity, Urine: 1.025 (ref 1.005–1.030)
pH: 6 (ref 5.0–8.0)

## 2023-08-04 LAB — COMPREHENSIVE METABOLIC PANEL
ALT: 27 U/L (ref 0–44)
AST: 28 U/L (ref 15–41)
Albumin: 3.8 g/dL (ref 3.5–5.0)
Alkaline Phosphatase: 49 U/L (ref 38–126)
Anion gap: 9 (ref 5–15)
BUN: 12 mg/dL (ref 8–23)
CO2: 20 mmol/L — ABNORMAL LOW (ref 22–32)
Calcium: 9 mg/dL (ref 8.9–10.3)
Chloride: 104 mmol/L (ref 98–111)
Creatinine, Ser: 1.21 mg/dL (ref 0.61–1.24)
GFR, Estimated: >60 mL/min (ref >60)
Glucose, Bld: 119 mg/dL — ABNORMAL HIGH (ref 70–99)
Potassium: 3.6 mmol/L (ref 3.5–5.1)
Sodium: 133 mmol/L — ABNORMAL LOW (ref 135–145)
Total Bilirubin: 0.9 mg/dL (ref <1.2)
Total Protein: 6.7 g/dL (ref 6.5–8.1)

## 2023-08-04 LAB — SURGICAL PCR SCREEN
MRSA, PCR: NEGATIVE
Staphylococcus aureus: NEGATIVE

## 2023-08-04 LAB — CBC
HCT: 46.7 % (ref 39.0–52.0)
Hemoglobin: 15.6 g/dL (ref 13.0–17.0)
MCH: 33.8 pg (ref 26.0–34.0)
MCHC: 33.4 g/dL (ref 30.0–36.0)
MCV: 101.1 fL — ABNORMAL HIGH (ref 80.0–100.0)
Platelets: 212 10*3/uL (ref 150–400)
RBC: 4.62 MIL/uL (ref 4.22–5.81)
RDW: 12.9 % (ref 11.5–15.5)
WBC: 11.1 10*3/uL — ABNORMAL HIGH (ref 4.0–10.5)
nRBC: 0 % (ref 0.0–0.2)

## 2023-08-04 LAB — PROTIME-INR
INR: 1 (ref 0.8–1.2)
Prothrombin Time: 13.5 s (ref 11.4–15.2)

## 2023-08-04 LAB — SARS CORONAVIRUS 2 (TAT 6-24 HRS): SARS Coronavirus 2: NEGATIVE

## 2023-08-04 LAB — APTT: aPTT: 29 s (ref 24–36)

## 2023-08-04 NOTE — Progress Notes (Signed)
T/S positive for antibodies at pre-op appointment. T/S will need to be re-collected DOS with the same armband used ar pre-op visit. Order placed

## 2023-08-04 NOTE — Progress Notes (Signed)
PCP - Dr. Mila Palmer Cardiologist - Denies  PPM/ICD - Denies Device Orders - n/a Rep Notified - n/a  Chest x-ray - 08/04/2023 EKG - 08/04/2023 Stress Test - Per pt, many years ago ECHO - Denies Cardiac Cath - Denies  Sleep Study - Denies CPAP - n/a  Pt is DM2. He checks his blood sugar every morning. Normal fasting range is 100-120s. CBG at pre-op appointment 123. A1c result pending.  Last dose of GLP1 agonist- n/a GLP1 instructions: n/a  Blood Thinner Instructions: n/a Aspirin Instructions: Pt will continue taking his ASA through day before surgery and NONE morning of surgery  NPO after midnight  COVID TEST- Yes. Result pending.   Anesthesia review: Yes. Recent cystoscopy early November. Pt removed his foley catheter this morning and voided prior to appointment, but blood was noted in urine. Ryans Shon Baton, RN with [facility] made aware. EKG review.   Patient denies shortness of breath, fever, cough and chest pain at PAT appointment. Pt denies any respiratory illness/infection in the last two months.    All instructions explained to the patient, with a verbal understanding of the material. Patient agrees to go over the instructions while at home for a better understanding. Patient also instructed to self quarantine after being tested for COVID-19. The opportunity to ask questions was provided.

## 2023-08-08 ENCOUNTER — Encounter (HOSPITAL_COMMUNITY)
Admission: RE | Disposition: A | Discharge status: Home / Self Care | Payer: Self-pay | Source: Home / Self Care | Attending: Thoracic Surgery (Cardiothoracic Vascular Surgery)

## 2023-08-08 ENCOUNTER — Inpatient Hospital Stay (HOSPITAL_COMMUNITY): Payer: Medicare Other | Admitting: Certified Registered"

## 2023-08-08 ENCOUNTER — Encounter (HOSPITAL_COMMUNITY): Payer: Self-pay | Admitting: Thoracic Surgery (Cardiothoracic Vascular Surgery)

## 2023-08-08 ENCOUNTER — Inpatient Hospital Stay (HOSPITAL_COMMUNITY): Payer: Medicare Other | Admitting: Physician Assistant

## 2023-08-08 ENCOUNTER — Inpatient Hospital Stay (HOSPITAL_COMMUNITY)
Admission: RE | Admit: 2023-08-08 | Discharge: 2023-08-11 | DRG: 164 | Disposition: A | Discharge status: Home / Self Care | Payer: Medicare Other | Attending: Thoracic Surgery (Cardiothoracic Vascular Surgery) | Admitting: Thoracic Surgery (Cardiothoracic Vascular Surgery)

## 2023-08-08 ENCOUNTER — Inpatient Hospital Stay (HOSPITAL_COMMUNITY): Payer: Medicare Other

## 2023-08-08 ENCOUNTER — Other Ambulatory Visit: Payer: Self-pay

## 2023-08-08 DIAGNOSIS — E785 Hyperlipidemia, unspecified: Secondary | ICD-10-CM | POA: Diagnosis not present

## 2023-08-08 DIAGNOSIS — J95811 Postprocedural pneumothorax: Secondary | ICD-10-CM | POA: Diagnosis not present

## 2023-08-08 DIAGNOSIS — Z8042 Family history of malignant neoplasm of prostate: Secondary | ICD-10-CM

## 2023-08-08 DIAGNOSIS — Z48813 Encounter for surgical aftercare following surgery on the respiratory system: Secondary | ICD-10-CM | POA: Diagnosis not present

## 2023-08-08 DIAGNOSIS — Z801 Family history of malignant neoplasm of trachea, bronchus and lung: Secondary | ICD-10-CM

## 2023-08-08 DIAGNOSIS — E1140 Type 2 diabetes mellitus with diabetic neuropathy, unspecified: Secondary | ICD-10-CM | POA: Diagnosis not present

## 2023-08-08 DIAGNOSIS — Z7982 Long term (current) use of aspirin: Secondary | ICD-10-CM

## 2023-08-08 DIAGNOSIS — Z888 Allergy status to other drugs, medicaments and biological substances status: Secondary | ICD-10-CM | POA: Diagnosis not present

## 2023-08-08 DIAGNOSIS — Z8711 Personal history of peptic ulcer disease: Secondary | ICD-10-CM | POA: Diagnosis not present

## 2023-08-08 DIAGNOSIS — Z902 Acquired absence of lung [part of]: Secondary | ICD-10-CM

## 2023-08-08 DIAGNOSIS — J438 Other emphysema: Secondary | ICD-10-CM | POA: Diagnosis present

## 2023-08-08 DIAGNOSIS — Z808 Family history of malignant neoplasm of other organs or systems: Secondary | ICD-10-CM | POA: Diagnosis not present

## 2023-08-08 DIAGNOSIS — J9811 Atelectasis: Secondary | ICD-10-CM | POA: Diagnosis not present

## 2023-08-08 DIAGNOSIS — Z7984 Long term (current) use of oral hypoglycemic drugs: Secondary | ICD-10-CM | POA: Diagnosis not present

## 2023-08-08 DIAGNOSIS — Z8701 Personal history of pneumonia (recurrent): Secondary | ICD-10-CM | POA: Diagnosis not present

## 2023-08-08 DIAGNOSIS — C3432 Malignant neoplasm of lower lobe, left bronchus or lung: Secondary | ICD-10-CM | POA: Diagnosis not present

## 2023-08-08 DIAGNOSIS — F17290 Nicotine dependence, other tobacco product, uncomplicated: Secondary | ICD-10-CM | POA: Diagnosis present

## 2023-08-08 DIAGNOSIS — F32A Depression, unspecified: Secondary | ICD-10-CM | POA: Diagnosis present

## 2023-08-08 DIAGNOSIS — Z4682 Encounter for fitting and adjustment of non-vascular catheter: Secondary | ICD-10-CM | POA: Diagnosis not present

## 2023-08-08 DIAGNOSIS — M1990 Unspecified osteoarthritis, unspecified site: Secondary | ICD-10-CM | POA: Diagnosis present

## 2023-08-08 DIAGNOSIS — C3490 Malignant neoplasm of unspecified part of unspecified bronchus or lung: Secondary | ICD-10-CM | POA: Diagnosis present

## 2023-08-08 DIAGNOSIS — N400 Enlarged prostate without lower urinary tract symptoms: Secondary | ICD-10-CM | POA: Diagnosis present

## 2023-08-08 DIAGNOSIS — M25511 Pain in right shoulder: Secondary | ICD-10-CM | POA: Diagnosis not present

## 2023-08-08 DIAGNOSIS — J939 Pneumothorax, unspecified: Secondary | ICD-10-CM | POA: Diagnosis not present

## 2023-08-08 DIAGNOSIS — C3431 Malignant neoplasm of lower lobe, right bronchus or lung: Secondary | ICD-10-CM | POA: Diagnosis not present

## 2023-08-08 DIAGNOSIS — Z79890 Hormone replacement therapy (postmenopausal): Secondary | ICD-10-CM

## 2023-08-08 DIAGNOSIS — E119 Type 2 diabetes mellitus without complications: Secondary | ICD-10-CM | POA: Diagnosis not present

## 2023-08-08 DIAGNOSIS — R9389 Abnormal findings on diagnostic imaging of other specified body structures: Secondary | ICD-10-CM | POA: Diagnosis not present

## 2023-08-08 DIAGNOSIS — J432 Centrilobular emphysema: Secondary | ICD-10-CM | POA: Diagnosis present

## 2023-08-08 DIAGNOSIS — R001 Bradycardia, unspecified: Secondary | ICD-10-CM | POA: Diagnosis not present

## 2023-08-08 DIAGNOSIS — E039 Hypothyroidism, unspecified: Secondary | ICD-10-CM | POA: Diagnosis present

## 2023-08-08 DIAGNOSIS — F17210 Nicotine dependence, cigarettes, uncomplicated: Secondary | ICD-10-CM | POA: Diagnosis not present

## 2023-08-08 DIAGNOSIS — Z8249 Family history of ischemic heart disease and other diseases of the circulatory system: Secondary | ICD-10-CM | POA: Diagnosis not present

## 2023-08-08 DIAGNOSIS — Z79899 Other long term (current) drug therapy: Secondary | ICD-10-CM

## 2023-08-08 DIAGNOSIS — C3491 Malignant neoplasm of unspecified part of right bronchus or lung: Secondary | ICD-10-CM | POA: Insufficient documentation

## 2023-08-08 DIAGNOSIS — T797XXA Traumatic subcutaneous emphysema, initial encounter: Secondary | ICD-10-CM | POA: Diagnosis not present

## 2023-08-08 DIAGNOSIS — J449 Chronic obstructive pulmonary disease, unspecified: Secondary | ICD-10-CM | POA: Diagnosis not present

## 2023-08-08 DIAGNOSIS — Z8041 Family history of malignant neoplasm of ovary: Secondary | ICD-10-CM

## 2023-08-08 HISTORY — PX: LYMPH NODE BIOPSY: SHX201

## 2023-08-08 HISTORY — PX: INTERCOSTAL NERVE BLOCK: SHX5021

## 2023-08-08 LAB — GLUCOSE, CAPILLARY
Glucose-Capillary: 132 mg/dL — ABNORMAL HIGH (ref 70–99)
Glucose-Capillary: 175 mg/dL — ABNORMAL HIGH (ref 70–99)
Glucose-Capillary: 214 mg/dL — ABNORMAL HIGH (ref 70–99)
Glucose-Capillary: 172 mg/dL — ABNORMAL HIGH (ref 70–99)

## 2023-08-08 LAB — TYPE AND SCREEN
ABO/RH(D): O POS
Antibody Screen: NEGATIVE

## 2023-08-08 SURGERY — LOBECTOMY, LUNG, ROBOT-ASSISTED, USING VATS
Anesthesia: General | Site: Chest | Laterality: Right

## 2023-08-08 MED ORDER — SUGAMMADEX SODIUM 200 MG/2ML IV SOLN
INTRAVENOUS | Status: DC | PRN
  Administered 2023-08-08: 200 mg via INTRAVENOUS

## 2023-08-08 MED ORDER — CHLORHEXIDINE GLUCONATE CLOTH 2 % EX PADS
6.0000 | MEDICATED_PAD | Freq: Once | CUTANEOUS | Status: AC
  Administered 2023-08-08: 6 via TOPICAL

## 2023-08-08 MED ORDER — METHOCARBAMOL 500 MG PO TABS: 500.0000 mg | ORAL_TABLET | Freq: Three times a day (TID) | ORAL | Status: DC | PRN

## 2023-08-08 MED ORDER — AMISULPRIDE (ANTIEMETIC) 5 MG/2ML IV SOLN: 10.0000 mg | Freq: Once | INTRAVENOUS | Status: DC | PRN

## 2023-08-08 MED ORDER — MIDAZOLAM HCL 2 MG/2ML IJ SOLN
INTRAMUSCULAR | Status: AC
Start: 2023-08-08 — End: ?
  Filled 2023-08-08: qty 2

## 2023-08-08 MED ORDER — ORAL CARE MOUTH RINSE: 15.0000 mL | Freq: Once | OROMUCOSAL | Status: AC

## 2023-08-08 MED ORDER — CEFAZOLIN SODIUM-DEXTROSE 2-4 GM/100ML-% IV SOLN
2.0000 g | Freq: Three times a day (TID) | INTRAVENOUS | Status: AC
  Administered 2023-08-08 (×2): 2 g via INTRAVENOUS
  Filled 2023-08-08 (×2): qty 100

## 2023-08-08 MED ORDER — BUDESON-GLYCOPYRROL-FORMOTEROL 160-9-4.8 MCG/ACT IN AERO: 2.0000 | INHALATION_SPRAY | Freq: Two times a day (BID) | RESPIRATORY_TRACT | Status: DC

## 2023-08-08 MED ORDER — ENOXAPARIN SODIUM 40 MG/0.4ML IJ SOSY
40.0000 mg | PREFILLED_SYRINGE | Freq: Every day | INTRAMUSCULAR | Status: DC
  Administered 2023-08-08 – 2023-08-11 (×4): 40 mg via SUBCUTANEOUS
  Filled 2023-08-08 (×4): qty 0.4

## 2023-08-08 MED ORDER — PROPOFOL 10 MG/ML IV BOLUS
INTRAVENOUS | Status: AC
Start: 2023-08-08 — End: ?
  Filled 2023-08-08: qty 20

## 2023-08-08 MED ORDER — TRAMADOL HCL 50 MG PO TABS: 50.0000 mg | ORAL_TABLET | Freq: Four times a day (QID) | ORAL | Status: DC | PRN

## 2023-08-08 MED ORDER — ASPIRIN 81 MG PO TBEC
81.0000 mg | DELAYED_RELEASE_TABLET | Freq: Every day | ORAL | Status: DC
  Administered 2023-08-08 – 2023-08-11 (×4): 81 mg via ORAL
  Filled 2023-08-08 (×4): qty 1

## 2023-08-08 MED ORDER — CEFAZOLIN SODIUM-DEXTROSE 2-4 GM/100ML-% IV SOLN
2.0000 g | INTRAVENOUS | Status: AC
  Administered 2023-08-08: 2 g via INTRAVENOUS
  Filled 2023-08-08: qty 100

## 2023-08-08 MED ORDER — PHENYLEPHRINE 80 MCG/ML (10ML) SYRINGE FOR IV PUSH (FOR BLOOD PRESSURE SUPPORT)
PREFILLED_SYRINGE | INTRAVENOUS | Status: DC | PRN
  Administered 2023-08-08: 80 ug via INTRAVENOUS

## 2023-08-08 MED ORDER — CHLORHEXIDINE GLUCONATE CLOTH 2 % EX PADS
6.0000 | MEDICATED_PAD | Freq: Every day | CUTANEOUS | Status: DC
  Administered 2023-08-08 – 2023-08-09 (×2): 6 via TOPICAL

## 2023-08-08 MED ORDER — PHENYLEPHRINE HCL-NACL 20-0.9 MG/250ML-% IV SOLN
INTRAVENOUS | Status: DC | PRN
  Administered 2023-08-08: 20 ug/min via INTRAVENOUS

## 2023-08-08 MED ORDER — ACETAMINOPHEN 500 MG PO TABS
1000.0000 mg | ORAL_TABLET | Freq: Four times a day (QID) | ORAL | Status: DC
  Administered 2023-08-08 – 2023-08-11 (×10): 1000 mg via ORAL
  Filled 2023-08-08 (×10): qty 2

## 2023-08-08 MED ORDER — SODIUM CHLORIDE 0.9% IV SOLUTION
INTRAVENOUS | Status: AC | PRN
  Administered 2023-08-08: 1000 mL via INTRAMUSCULAR

## 2023-08-08 MED ORDER — INSULIN ASPART 100 UNIT/ML IJ SOLN: 0.0000 [IU] | Freq: Every day | INTRAMUSCULAR | Status: DC

## 2023-08-08 MED ORDER — ONDANSETRON HCL 4 MG/2ML IJ SOLN
INTRAMUSCULAR | Status: DC | PRN
  Administered 2023-08-08: 4 mg via INTRAVENOUS

## 2023-08-08 MED ORDER — BUPIVACAINE LIPOSOME 1.3 % IJ SUSP
INTRAMUSCULAR | Status: AC
  Filled 2023-08-08: qty 20

## 2023-08-08 MED ORDER — PROPOFOL 10 MG/ML IV BOLUS
INTRAVENOUS | Status: DC | PRN
  Administered 2023-08-08: 130 mg via INTRAVENOUS

## 2023-08-08 MED ORDER — ONDANSETRON HCL 4 MG/2ML IJ SOLN: 4.0000 mg | Freq: Four times a day (QID) | INTRAMUSCULAR | Status: DC | PRN

## 2023-08-08 MED ORDER — LIDOCAINE 2% (20 MG/ML) 5 ML SYRINGE
INTRAMUSCULAR | Status: DC | PRN
  Administered 2023-08-08: 60 mg via INTRAVENOUS

## 2023-08-08 MED ORDER — BUPIVACAINE HCL (PF) 0.5 % IJ SOLN
INTRAMUSCULAR | Status: AC
  Filled 2023-08-08: qty 30

## 2023-08-08 MED ORDER — ROSUVASTATIN CALCIUM 5 MG PO TABS
5.0000 mg | ORAL_TABLET | Freq: Every day | ORAL | Status: DC
Start: 2023-08-09 — End: 2023-08-11
  Administered 2023-08-09 – 2023-08-11 (×3): 5 mg via ORAL
  Filled 2023-08-08 (×3): qty 1

## 2023-08-08 MED ORDER — MORPHINE SULFATE (PF) 2 MG/ML IV SOLN
2.0000 mg | INTRAVENOUS | Status: DC | PRN
  Administered 2023-08-09: 2 mg via INTRAVENOUS
  Filled 2023-08-08: qty 1

## 2023-08-08 MED ORDER — TAMSULOSIN HCL 0.4 MG PO CAPS
0.4000 mg | ORAL_CAPSULE | Freq: Every day | ORAL | Status: DC
Start: 2023-08-09 — End: 2023-08-11
  Administered 2023-08-09 – 2023-08-11 (×3): 0.4 mg via ORAL
  Filled 2023-08-08 (×3): qty 1

## 2023-08-08 MED ORDER — DEXTROSE-SODIUM CHLORIDE 5-0.45 % IV SOLN
INTRAVENOUS | Status: DC
Start: 2023-08-08 — End: 2023-08-09

## 2023-08-08 MED ORDER — INSULIN ASPART 100 UNIT/ML IJ SOLN
0.0000 [IU] | Freq: Three times a day (TID) | INTRAMUSCULAR | Status: DC
  Administered 2023-08-08: 5 [IU] via SUBCUTANEOUS
  Administered 2023-08-09 – 2023-08-10 (×3): 2 [IU] via SUBCUTANEOUS
  Administered 2023-08-10: 3 [IU] via SUBCUTANEOUS

## 2023-08-08 MED ORDER — FENTANYL CITRATE (PF) 250 MCG/5ML IJ SOLN
INTRAMUSCULAR | Status: DC | PRN
  Administered 2023-08-08: 100 ug via INTRAVENOUS
  Administered 2023-08-08 (×3): 50 ug via INTRAVENOUS

## 2023-08-08 MED ORDER — SENNOSIDES-DOCUSATE SODIUM 8.6-50 MG PO TABS
1.0000 | ORAL_TABLET | Freq: Every day | ORAL | Status: DC
  Administered 2023-08-08 – 2023-08-10 (×2): 1 via ORAL
  Filled 2023-08-08 (×3): qty 1

## 2023-08-08 MED ORDER — CHLORHEXIDINE GLUCONATE 0.12 % MT SOLN
15.0000 mL | Freq: Once | OROMUCOSAL | Status: AC
  Administered 2023-08-08: 15 mL via OROMUCOSAL
  Filled 2023-08-08: qty 15

## 2023-08-08 MED ORDER — ACETAMINOPHEN 500 MG PO TABS
1000.0000 mg | ORAL_TABLET | Freq: Once | ORAL | Status: AC
  Administered 2023-08-08: 1000 mg via ORAL
  Filled 2023-08-08: qty 2

## 2023-08-08 MED ORDER — ALBUTEROL SULFATE HFA 108 (90 BASE) MCG/ACT IN AERS: 1.0000 | INHALATION_SPRAY | Freq: Four times a day (QID) | RESPIRATORY_TRACT | Status: DC | PRN

## 2023-08-08 MED ORDER — GABAPENTIN 300 MG PO CAPS: 300.0000 mg | ORAL_CAPSULE | Freq: Two times a day (BID) | ORAL | Status: DC

## 2023-08-08 MED ORDER — SODIUM CHLORIDE FLUSH 0.9 % IV SOLN
INTRAVENOUS | Status: DC | PRN
  Administered 2023-08-08: 100 mL

## 2023-08-08 MED ORDER — 0.9 % SODIUM CHLORIDE (POUR BTL) OPTIME
TOPICAL | Status: DC | PRN
  Administered 2023-08-08: 2000 mL

## 2023-08-08 MED ORDER — GABAPENTIN 300 MG PO CAPS
300.0000 mg | ORAL_CAPSULE | Freq: Every day | ORAL | Status: AC
  Administered 2023-08-08 – 2023-08-10 (×3): 300 mg via ORAL
  Filled 2023-08-08 (×3): qty 1

## 2023-08-08 MED ORDER — LACTATED RINGERS IV SOLN: INTRAVENOUS | Status: DC

## 2023-08-08 MED ORDER — EPHEDRINE SULFATE-NACL 50-0.9 MG/10ML-% IV SOSY
PREFILLED_SYRINGE | INTRAVENOUS | Status: DC | PRN
  Administered 2023-08-08: 5 mg via INTRAVENOUS

## 2023-08-08 MED ORDER — MOMETASONE FURO-FORMOTEROL FUM 200-5 MCG/ACT IN AERO
2.0000 | INHALATION_SPRAY | Freq: Two times a day (BID) | RESPIRATORY_TRACT | Status: DC
  Administered 2023-08-09 – 2023-08-11 (×4): 2 via RESPIRATORY_TRACT
  Filled 2023-08-08: qty 8.8

## 2023-08-08 MED ORDER — FENTANYL CITRATE (PF) 250 MCG/5ML IJ SOLN
INTRAMUSCULAR | Status: AC
  Filled 2023-08-08: qty 5

## 2023-08-08 MED ORDER — OXYCODONE HCL 5 MG PO TABS
5.0000 mg | ORAL_TABLET | ORAL | Status: DC | PRN
  Administered 2023-08-10 (×2): 5 mg via ORAL
  Filled 2023-08-08 (×2): qty 1

## 2023-08-08 MED ORDER — BISACODYL 5 MG PO TBEC
10.0000 mg | DELAYED_RELEASE_TABLET | Freq: Every day | ORAL | Status: DC
Start: 2023-08-08 — End: 2023-08-11
  Administered 2023-08-08 – 2023-08-09 (×2): 10 mg via ORAL
  Filled 2023-08-08 (×2): qty 2

## 2023-08-08 MED ORDER — PANTOPRAZOLE SODIUM 40 MG PO TBEC
40.0000 mg | DELAYED_RELEASE_TABLET | Freq: Every day | ORAL | Status: DC
Start: 2023-08-09 — End: 2023-08-11
  Administered 2023-08-09 – 2023-08-11 (×3): 40 mg via ORAL
  Filled 2023-08-08 (×3): qty 1

## 2023-08-08 MED ORDER — ROCURONIUM BROMIDE 10 MG/ML (PF) SYRINGE
PREFILLED_SYRINGE | INTRAVENOUS | Status: DC | PRN
  Administered 2023-08-08: 30 mg via INTRAVENOUS
  Administered 2023-08-08: 50 mg via INTRAVENOUS
  Administered 2023-08-08: 20 mg via INTRAVENOUS

## 2023-08-08 MED ORDER — LACTATED RINGERS IV SOLN: INTRAVENOUS | Status: DC | PRN

## 2023-08-08 MED ORDER — METFORMIN HCL 500 MG PO TABS
500.0000 mg | ORAL_TABLET | Freq: Two times a day (BID) | ORAL | Status: DC
Start: 2023-08-09 — End: 2023-08-11
  Administered 2023-08-09 – 2023-08-11 (×5): 500 mg via ORAL
  Filled 2023-08-08 (×5): qty 1

## 2023-08-08 MED ORDER — FENTANYL CITRATE (PF) 100 MCG/2ML IJ SOLN: 25.0000 ug | INTRAMUSCULAR | Status: DC | PRN

## 2023-08-08 MED ORDER — LEVOTHYROXINE SODIUM 88 MCG PO TABS
88.0000 ug | ORAL_TABLET | Freq: Every day | ORAL | Status: DC
  Administered 2023-08-09 – 2023-08-11 (×3): 88 ug via ORAL
  Filled 2023-08-08 (×3): qty 1

## 2023-08-08 MED ORDER — DEXAMETHASONE SODIUM PHOSPHATE 10 MG/ML IJ SOLN
INTRAMUSCULAR | Status: DC | PRN
  Administered 2023-08-08: 5 mg via INTRAVENOUS

## 2023-08-08 MED ORDER — UMECLIDINIUM BROMIDE 62.5 MCG/ACT IN AEPB
1.0000 | INHALATION_SPRAY | Freq: Every day | RESPIRATORY_TRACT | Status: DC
  Administered 2023-08-09 – 2023-08-11 (×3): 1 via RESPIRATORY_TRACT
  Filled 2023-08-08: qty 7

## 2023-08-08 MED ORDER — PROPOFOL 10 MG/ML IV BOLUS
INTRAVENOUS | Status: AC
  Filled 2023-08-08: qty 20

## 2023-08-08 MED ORDER — ACETAMINOPHEN 160 MG/5ML PO SOLN: 1000.0000 mg | Freq: Four times a day (QID) | ORAL | Status: DC

## 2023-08-08 SURGICAL SUPPLY — 84 items
BLADE CLIPPER SURG (BLADE) IMPLANT
CANISTER SUCT 3000ML PPV (MISCELLANEOUS) ×2 IMPLANT
CANNULA REDUCER 12-8 DVNC XI (CANNULA) ×2 IMPLANT
CATH THORACIC 28FR (CATHETERS) IMPLANT
CNTNR URN SCR LID CUP LEK RST (MISCELLANEOUS) ×5 IMPLANT
CONN ST 1/4X3/8 BEN (MISCELLANEOUS) IMPLANT
CONT SPEC 4OZ STRL OR WHT (MISCELLANEOUS) ×13
DEFOGGER SCOPE WARMER CLEARIFY (MISCELLANEOUS) ×1 IMPLANT
DERMABOND ADVANCED .7 DNX12 (GAUZE/BANDAGES/DRESSINGS) ×1 IMPLANT
DERMABOND ADVANCED .7 DNX6 (GAUZE/BANDAGES/DRESSINGS) IMPLANT
DRAIN CHANNEL 28F RND 3/8 FF (WOUND CARE) IMPLANT
DRAIN CHANNEL 32F RND 10.7 FF (WOUND CARE) IMPLANT
DRAPE ARM DVNC X/XI (DISPOSABLE) ×4 IMPLANT
DRAPE COLUMN DVNC XI (DISPOSABLE) ×1 IMPLANT
DRAPE CV SPLIT W-CLR ANES SCRN (DRAPES) ×1 IMPLANT
DRAPE HALF SHEET 40X57 (DRAPES) ×1 IMPLANT
DRAPE INCISE IOBAN 66X45 STRL (DRAPES) IMPLANT
DRAPE SURG ORHT 6 SPLT 77X108 (DRAPES) ×1 IMPLANT
ELECT BLADE 6.5 EXT (BLADE) ×1 IMPLANT
ELECT REM PT RETURN 9FT ADLT (ELECTROSURGICAL) ×1
ELECTRODE REM PT RTRN 9FT ADLT (ELECTROSURGICAL) ×1 IMPLANT
FORCEPS BPLR FENES DVNC XI (FORCEP) IMPLANT
FORCEPS BPLR LNG DVNC XI (INSTRUMENTS) IMPLANT
GAUZE KITTNER 4X5 RF (MISCELLANEOUS) ×2 IMPLANT
GAUZE SPONGE 4X4 12PLY STRL (GAUZE/BANDAGES/DRESSINGS) ×1 IMPLANT
GLOVE SS BIOGEL STRL SZ 7.5 (GLOVE) ×2 IMPLANT
GLOVE SURG POLYISO LF SZ8 (GLOVE) ×1 IMPLANT
GOWN STRL REUS W/ TWL LRG LVL3 (GOWN DISPOSABLE) ×2 IMPLANT
GOWN STRL REUS W/ TWL XL LVL3 (GOWN DISPOSABLE) ×2 IMPLANT
GOWN STRL REUS W/TWL 2XL LVL3 (GOWN DISPOSABLE) ×1 IMPLANT
GOWN STRL REUS W/TWL LRG LVL3 (GOWN DISPOSABLE) ×2
GOWN STRL REUS W/TWL XL LVL3 (GOWN DISPOSABLE) ×2
GRASPER TIP-UP FEN DVNC XI (INSTRUMENTS) IMPLANT
HEMOSTAT SURGICEL 2X14 (HEMOSTASIS) ×3 IMPLANT
IRRIGATION STRYKERFLOW (MISCELLANEOUS) ×1 IMPLANT
IRRIGATOR STRYKERFLOW (MISCELLANEOUS) ×1
KIT BASIN OR (CUSTOM PROCEDURE TRAY) ×1 IMPLANT
KIT SUCTION CATH 14FR (SUCTIONS) IMPLANT
KIT TURNOVER KIT B (KITS) ×1 IMPLANT
NDL HYPO 25GX1X1/2 BEV (NEEDLE) ×1 IMPLANT
NDL SPNL 22GX3.5 QUINCKE BK (NEEDLE) ×1 IMPLANT
NEEDLE HYPO 25GX1X1/2 BEV (NEEDLE) ×1 IMPLANT
NEEDLE SPNL 22GX3.5 QUINCKE BK (NEEDLE) ×1 IMPLANT
NS IRRIG 1000ML POUR BTL (IV SOLUTION) ×2 IMPLANT
PACK CHEST (CUSTOM PROCEDURE TRAY) ×1 IMPLANT
PAD ARMBOARD 7.5X6 YLW CONV (MISCELLANEOUS) ×2 IMPLANT
PORT ACCESS TROCAR AIRSEAL 12 (TROCAR) ×1 IMPLANT
RELOAD STAPLE 45 2.5 WHT DVNC (STAPLE) IMPLANT
RELOAD STAPLE 45 3.5 BLU DVNC (STAPLE) IMPLANT
RELOAD STAPLE 45 4.3 GRN DVNC (STAPLE) IMPLANT
RELOAD STAPLER 2.5X45 WHT DVNC (STAPLE) ×3 IMPLANT
RELOAD STAPLER 3.5X45 BLU DVNC (STAPLE) ×4 IMPLANT
RELOAD STAPLER 4.3X45 GRN DVNC (STAPLE) ×7 IMPLANT
SCISSORS LAP 5X35 DISP (ENDOMECHANICALS) IMPLANT
SEAL UNIV 5-12 XI (MISCELLANEOUS) ×4 IMPLANT
SET TRI-LUMEN FLTR TB AIRSEAL (TUBING) ×1 IMPLANT
SOL ELECTROSURG ANTI STICK (MISCELLANEOUS) ×1
SOLUTION ELECTROSURG ANTI STCK (MISCELLANEOUS) ×1 IMPLANT
SPONGE INTESTINAL PEANUT (DISPOSABLE) IMPLANT
SPONGE TONSIL 1 RF SGL (DISPOSABLE) IMPLANT
STAPLER 45 SUREFORM CVD DVNC (STAPLE) IMPLANT
STAPLER RELOAD 2.5X45 WHT DVNC (STAPLE) ×3
STAPLER RELOAD 3.5X45 BLU DVNC (STAPLE) ×4
STAPLER RELOAD 4.3X45 GRN DVNC (STAPLE) ×7
SUT PROLENE 4 0 RB 1 (SUTURE)
SUT PROLENE 4-0 RB1 .5 CRCL 36 (SUTURE) IMPLANT
SUT SILK 1 MH (SUTURE) ×1 IMPLANT
SUT SILK 2 0 SH (SUTURE) ×1 IMPLANT
SUT SILK 2 0SH CR/8 30 (SUTURE) IMPLANT
SUT SILK 3 0SH CR/8 30 (SUTURE) IMPLANT
SUT VIC AB 1 CTX 36 (SUTURE) ×1
SUT VIC AB 1 CTX36XBRD ANBCTR (SUTURE) ×1 IMPLANT
SUT VIC AB 2-0 CTX 36 (SUTURE) ×1 IMPLANT
SUT VIC AB 3-0 X1 27 (SUTURE) ×2 IMPLANT
SUT VICRYL 0 TIES 12 18 (SUTURE) ×1 IMPLANT
SUT VICRYL 0 UR6 27IN ABS (SUTURE) ×2 IMPLANT
SUT VICRYL 2 TP 1 (SUTURE) IMPLANT
SYR 20CC LL (SYRINGE) ×2 IMPLANT
SYSTEM RETRIEVAL ANCHOR 15 (MISCELLANEOUS) IMPLANT
SYSTEM SAHARA CHEST DRAIN ATS (WOUND CARE) ×1 IMPLANT
TAPE CLOTH 4X10 WHT NS (GAUZE/BANDAGES/DRESSINGS) ×1 IMPLANT
TOWEL GREEN STERILE (TOWEL DISPOSABLE) ×1 IMPLANT
TRAY FOLEY MTR SLVR 16FR STAT (SET/KITS/TRAYS/PACK) ×1 IMPLANT
WATER STERILE IRR 1000ML POUR (IV SOLUTION) ×2 IMPLANT

## 2023-08-08 NOTE — Plan of Care (Signed)

## 2023-08-08 NOTE — Interval H&P Note (Signed)
History and Physical Interval Note:  08/08/2023 7:14 AM  Leonard Walton  has presented today for surgery, with the diagnosis of RLL NONSMALL CELL CARCINOMA.  The various methods of treatment have been discussed with the patient and family. After consideration of risks, benefits and other options for treatment, the patient has consented to  Procedure(s): XI ROBOTIC ASSISTED THORACOSCOPY-RIGHT LOWER LOBECTOMY (Right) as a surgical intervention.  The patient's history has been reviewed, patient examined, no change in status, stable for surgery.  I have reviewed the patient's chart and labs.  Questions were answered to the patient's satisfaction.     Loreli Slot

## 2023-08-08 NOTE — Anesthesia Preprocedure Evaluation (Signed)
Anesthesia Evaluation  Patient identified by MRN, date of birth, ID band Patient awake    Reviewed: Allergy & Precautions, NPO status , Patient's Chart, lab work & pertinent test results  History of Anesthesia Complications Negative for: history of anesthetic complications  Airway Mallampati: III  TM Distance: >3 FB Neck ROM: Full    Dental  (+) Dental Advisory Given, Upper Dentures   Pulmonary neg shortness of breath, neg sleep apnea, COPD, neg recent URI, Current Smoker and Patient abstained from smoking. Non-small cell lung cancer of RLL   Pulmonary exam normal breath sounds clear to auscultation       Cardiovascular (-) hypertension(-) angina (-) Past MI, (-) Cardiac Stents and (-) CABG (-) dysrhythmias  Rhythm:Regular Rate:Normal  HLD   Neuro/Psych neg Seizures PSYCHIATRIC DISORDERS  Depression    negative neurological ROS     GI/Hepatic negative GI ROS,,,(+)     substance abuse  alcohol use  Endo/Other  diabetes, Type 2, Oral Hypoglycemic AgentsHypothyroidism    Renal/GU negative Renal ROS   Bladder lesion    Musculoskeletal  (+) Arthritis , Osteoarthritis,    Abdominal  (+) + obese  Peds  Hematology negative hematology ROS (+)     Anesthesia Other Findings   Reproductive/Obstetrics                             Anesthesia Physical Anesthesia Plan  ASA: 3  Anesthesia Plan: General   Post-op Pain Management: Tylenol PO (pre-op)*   Induction: Intravenous  PONV Risk Score and Plan: 1 and Ondansetron, Dexamethasone and Treatment may vary due to age or medical condition  Airway Management Planned: Double Lumen EBT  Additional Equipment: Arterial line  Intra-op Plan:   Post-operative Plan: Possible Post-op intubation/ventilation  Informed Consent: I have reviewed the patients History and Physical, chart, labs and discussed the procedure including the risks, benefits and  alternatives for the proposed anesthesia with the patient or authorized representative who has indicated his/her understanding and acceptance.     Dental advisory given  Plan Discussed with: CRNA and Anesthesiologist  Anesthesia Plan Comments:         Anesthesia Quick Evaluation

## 2023-08-08 NOTE — Brief Op Note (Addendum)
08/08/2023  10:59 AM  PATIENT:  Leonard Walton  72 y.o. 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,N0)  PROCEDURE:   Xi ROBOTIC ASSISTED RIGHT LOWER LOBECTOMY LYMPH NODE DISSECTION INTERCOSTAL NERVE BLOCKS  SURGEON:  Surgeons and Role:    * Loreli Slot, MD - Primary  PHYSICIAN ASSISTANT: WAYNE GOLD PA-C   ANESTHESIA:   general  EBL:  50 mL   BLOOD ADMINISTERED:none  DRAINS:  1 28 F Chest Tube(s) in the RIGHT HEMITHORAX    LOCAL MEDICATIONS USED:  BUPIVICAINE  and  EXPAREL  SPECIMEN:  Source of Specimen:  RIGHT LOWER LOBE AND MULTIPLE LN SAMPLES  DISPOSITION OF SPECIMEN:  PATHOLOGY  COUNTS:  YES  TOURNIQUET:  * No tourniquets in log *  DICTATION: .Other Dictation: Dictation Number PENDING  PLAN OF CARE: Admit to inpatient   PATIENT DISPOSITION:  PACU - hemodynamically stable.   Delay start of Pharmacological VTE agent (>24hrs) due to surgical blood loss or risk of bleeding: no  COMPLICATIONS: NO KNOWN

## 2023-08-08 NOTE — Op Note (Signed)
NAME: GERROD, BILLUPS MEDICAL RECORD NO: [id number] ACCOUNT NO: [id number] DATE OF BIRTH: 11/09/1950 FACILITY: [facility] LOCATION: [facility] PHYSICIAN: Salvatore Decent. Dorris Fetch, MD  Operative Report   DATE OF PROCEDURE: 08/08/2023  PREOPERATIVE DIAGNOSIS:  Adenocarcinoma right lower lobe, clinical stage IA (T1, N0).  POSTOPERATIVE DIAGNOSIS:  Adenocarcinoma right lower lobe, clinical stage IA (T1, N0).  PROCEDURE:   Xi robotic assisted right lower lobectomy,  Lymph node dissection, and  Intercostal nerve blocks levels 3 through 10.  SURGEON:  Salvatore Decent. Dorris Fetch, MD  ASSISTANT:  Gershon Crane, PA-C.    ANESTHESIA:  General.  FINDINGS:  Nodule clearly palpable within left lower lobe.  Bronchial margin negative for tumor.  Some enlarged but otherwise benign-appearing lymph nodes.  CLINICAL NOTE: Mr. Werthman is a 72 year old gentleman with a history of tobacco use who was found to have a new nodule on a low-dose CT for lung cancer screening in July.  Biopsy showed non-small cell carcinoma.  He was advised to undergo surgical resection with a right lower lobectomy.  The indications, risks, benefits, and alternatives were discussed in detail with the patient.  He understood and accepted the risks and agreed to proceed.  OPERATIVE NOTE: Mr. Gholar was brought to the operating room on 08/08/2023.  He had induction of general anesthesia and was intubated with a double-lumen endotracheal tube.  Intravenous antibiotics were administered.  A Foley catheter was placed.  Sequential compression devices were placed on the calves for DVT prophylaxis.  He was placed in a left lateral decubitus position.  A Bair Hugger was placed for active warming.  The right chest was prepped and draped in the usual sterile fashion.  Single lung ventilation of the left lung was initiated was tolerated well throughout the procedure.  A timeout was performed.  A solution containing 20 mL of liposomal bupivacaine, 30 mL of 0.5%  bupivacaine, and 50 mL of saline was prepared.  This solution was used for local at the incision sites as well as for the intercostal nerve blocks.  An incision was made in the eighth interspace in the mid axillary line and an 8-mm robotic port was inserted.  The thoracoscope was advanced into the chest.  After confirming intrapleural placement, carbon dioxide was  insufflated per protocol.  A 12-mm robotic port was placed in the seventh interspace anterior to the camera port.  Intercostal nerve blocks then were performed in the third to the tenth interspace by injecting 10 mL of the bupivacaine solution into a subpleural plane at each level.  A 12-mm AirSeal port was placed in the ninth interspace posterolaterally.  Two additional eighth interspace robotic ports were placed.  The robot was deployed.  The camera arm was docked.  Targeting was performed.  The remaining arms were docked.  The  robotic instruments were inserted with thoracoscopic visualization.  The lower lobe was retracted superiorly.  The lymph node dissection was initiated.  The inferior pulmonary ligament was divided using bipolar cautery.  A level 9 node was removed.  A level 8 node was also identified and removed.  These were benign in appearance.  The pleural reflection was divided at the hilum posteriorly and a very small level 7 node was removed followed by a much larger level 7 node.  Again, these nodes appeared to be grossly benign.  The pleura overlying the bronchus intermedius was incised and the space between the right upper lobe bronchus and bronchus intermedius was explored, but no node was visible from this approach.  The upper lobe then was retracted inferiorly.  The pleura was divided between the azygous vein and the hilum.  There was no node in that area either.  The pleura was divided superior to the azygous vein over the mediastinum and a level 4R node was removed.  Pleural reflection was divided anteriorly and the middle  lobe vein was identified.  The patient had an unusual fissure and it was also incomplete.  It was unusual as there was a step-off of the lower lobe around the confluence of the fissures in close proximity to the tumor.  Dissection of the fissure was begun between the middle and lower lobes.  The fissure was completed using the robotic stapler.  The anterior portion of the fissure was completed and a level 11 node at the origin of the right middle lobe bronchus was identified and removed.  A level 12 node was removed from along the pulmonary artery and a level 10 node was removed anteriorly along the inferior pulmonary vein.  The fissure then was completed working posteriorly.  There was unusual pulmonary artery anatomy.  The most medial basilar segmental branches were arising from higher on the basilar segmental trunk than the other segmental branches and then there was a very large superior segmental branch and also a very large posterior ascending branch to the right upper lobe.  Dissection was carried out between those vessels and as the fissure was completed, a large level 11 node in that area came into view and was removed and sent as a separate specimen.  The basilar segmental trunk was encircled and was divided using the robotic stapler and then the superior segmental branch  also was divided.  The inferior pulmonary vein was divided.  Finally, the stapler was placed across the right lower lobe bronchus at its origin and closed.  Test inflation showed good aeration of the upper and middle lobes.  The stapler was fired transecting the bronchus.  The chest was copiously irrigated with saline.  A test inflation to 30 cm of water revealed no air leak from the bronchial stump.  The sponge and vessel loops placed during the dissection were removed.  The robotic instruments were removed.  The robot was undocked.  The anterior eighth interspace incision was lengthened to approximately 3 cm in length.  A 15-mm  endoscopic retrieval bag was placed into the chest and the lower lobe was manipulated into the bag, which was then removed without difficulty.  The nodule was palpable in the specimen.  It was sent for frozen section of the bronchial margin, which subsequently returned with no tumor seen.  Final inspection was made for hemostasis at the port sites and the staple lines.  A 28-French chest tube was placed through the original eighth interspace incision and secured with a #1 silk suture.  Dual lung ventilation was resumed.  The incisions were closed in standard fashion.  Dermabond was applied.  The chest tube was placed to a Pleur Evac on water seal.  The patient was placed back in the supine position.  He was extubated in the operating room and taken to the postanesthesia care unit in good condition.  All sponge, needle, and instrument counts were correct at the end of the procedure.  Experience assistance was necessary for this case due to surgical complexity.  Gershon Crane assisted with port placement, robot docking and undocking, instrument exchange, specimen retrieval, suctioning, and wound closure.   PUS D: 08/08/2023 4:02:46 pm T: 08/08/2023 6:29:00 pm  JOB: [id number]

## 2023-08-08 NOTE — Hospital Course (Addendum)
HPI: This is a 72 year old man with a past medical history significant for tobacco abuse, COPD, hyperlipidemia, hypothyroidism, type 2 diabetes complicated by diabetic neuropathy, arthritis, depression, and peptic ulcer disease.  He smoked about a pack a day for 50 years.  Currently down to about 1/3 pack/day.   He had a low-dose CT for lung cancer screening in July.  It showed a new 10 mm spiculated central right lower lobe nodule along with centrilobular and paraseptal emphysema.  There was no mediastinal adenopathy.  He had a robotic bronchoscopy by Dr. Delton Coombes.  It showed non-small cell carcinoma.   As noted he is currently smoking about 1/3 pack/day.  No chest pain, pressure, or tightness.  He does get winded with exertion but can walk up a flight of stairs without stopping and thinks he can go up 2 flights.  Activity is very limited by arthritis in his knees and hips.  No unusual headaches, visual changes, or seizures.  No change in appetite or weight loss.   Dr. Dorris Fetch discussed the treatment options for stage Ia lung cancer. That includes surgical resection and radiation. Dr. Dorris Fetch emphasized that these are not equivalent treatments and that surgical resection is the Chryl Holten standard. They did discuss the advantages and disadvantages of each. Patient wished to proceed with surgery. Potential risks, benefits,a and complications of the surgery were discussed with the patient and he agreed to proceed.  Hospital Course:  The patient was admitted electively and taken the operating room at which time he underwent a robotic assisted thoracoscopy for right lower lobe lobectomy and lymph node dissection.  He tolerated the procedure well was taken to the postanesthesia care unit in stable condition. A line and foley were removed. Chest tube was to water seal. There was tidling with cough but no true air leak. Daily chest x rays were obtained and remained stable. Chest tube was removed on 11/20. Same  day chest x ray showed persistent, small right apical pneumothorax and right base atelectasis. He has been tolerating a diet. He is ambulating on room air with good oxygenation. All wounds are clean, dry, healing without signs of infection. PA/LAT CXR 11/21 showed **. Of note, patient just received a prescription for Percocet (he has all of pills as has not taken any yet). As a result, he will only get prescription for Gabapentin at discharge. Patient is felt stable for discharge to home.

## 2023-08-08 NOTE — Discharge Instructions (Signed)
Robot-Assisted Thoracic Surgery, Care After The following information offers guidance on how to care for yourself after your procedure. Your health care provider may also give you more specific instructions. If you have problems or questions, contact your health care provider. What can I expect after the procedure? After the procedure, it is common to have: Some pain and aches in the area of your surgical incisions. Pain when breathing in (inhaling) and coughing. Tiredness (fatigue). Trouble sleeping. Constipation. Follow these instructions at home: Medicines Take over-the-counter and prescription medicines only as told by your health care provider. If you were prescribed an antibiotic medicine, take it as told by your health care provider. Do not stop taking the antibiotic even if you start to feel better. Talk with your health care provider about safe and effective ways to manage pain after your procedure. Pain management should fit your specific health needs. Take pain medicine before pain becomes severe. Relieving and controlling your pain will make breathing easier for you. Ask your health care provider if the medicine prescribed to you requires you to avoid driving or using machinery. Eating and drinking Follow instructions from your health care provider about eating or drinking restrictions. These will vary depending on what procedure you had. Your health care provider may recommend: A liquid diet or soft diet for the first few days. Meals that are smaller and more frequent. A diet of fruits, vegetables, whole grains, and low-fat proteins. Limiting foods that are high in fat and processed sugar, including fried or sweet foods. Incision care Follow instructions from your health care provider about how to take care of your incisions. Make sure you: Wash your hands with soap and water for at least 20 seconds before and after you change your bandage (dressing). If soap and water are not  available, use hand sanitizer. Change your dressing as told by your health care provider. Leave stitches (sutures), skin glue, or adhesive strips in place. These skin closures may need to stay in place for 2 weeks or longer. If adhesive strip edges start to loosen and curl up, you may trim the loose edges. Do not remove adhesive strips completely unless your health care provider tells you to do that. Check your incision area every day for signs of infection. Check for: Redness, swelling, or more pain. Fluid or blood. Warmth. Pus or a bad smell. Activity Return to your normal activities as told by your health care provider. Ask your health care provider what activities are safe for you. Ask your health care provider when it is safe for you to drive. Do not lift anything that is heavier than 10 lb (4.5 kg), or the limit that you are told, until your health care provider says that it is safe. Rest as told by your health care provider. Avoid sitting for a long time without moving. Get up to take short walks every 1-2 hours. This is important to improve blood flow and breathing. Ask for help if you feel weak or unsteady. Do exercises as told by your health care provider. Pneumonia prevention  Do deep breathing exercises and cough regularly as directed. This helps clear mucus and opens your lungs. Doing this helps prevent lung infection (pneumonia). If you were given an incentive spirometer, use it as told. An incentive spirometer is a tool that measures how well you are filling your lungs with each breath. Coughing may hurt less if you try to support your chest. This is called splinting. Try one of these when you  cough: Hold a pillow against your chest. Place the palms of both hands on top of your incision area. Do not use any products that contain nicotine or tobacco. These products include cigarettes, chewing tobacco, and vaping devices, such as e-cigarettes. If you need help quitting, ask your  health care provider. Avoid secondhand smoke. General instructions If you have a drainage tube: Follow instructions from your health care provider about how to take care of it. Do not travel by airplane after your tube is removed until your health care provider tells you it is safe. You may need to take these actions to prevent or treat constipation: Drink enough fluid to keep your urine pale yellow. Take over-the-counter or prescription medicines. Eat foods that are high in fiber, such as beans, whole grains, and fresh fruits and vegetables. Limit foods that are high in fat and processed sugars, such as fried or sweet foods. Keep all follow-up visits. This is important. Contact a health care provider if: You have redness, swelling, or more pain around an incision. You have fluid or blood coming from an incision. An incision feels warm to the touch. You have pus or a bad smell coming from an incision. You have a fever. You cannot eat or drink without vomiting. Your pain medicine is not controlling your pain. Get help right away if: You have chest pain. Your heart is beating quickly. You have trouble breathing. You have trouble speaking. You are confused. You feel weak or dizzy, or you faint. These symptoms may represent a serious problem that is an emergency. Do not wait to see if the symptoms will go away. Get medical help right away. Call your local emergency services (911 in the [place] Do not drive yourself to the hospital. Summary Talk with your health care provider about safe and effective ways to manage pain after your procedure. Pain management should fit your specific health needs. Return to your normal activities as told by your health care provider. Ask your health care provider what activities are safe for you. Do deep breathing exercises and cough regularly as directed. This helps to clear mucus and prevent pneumonia. If it hurts to cough, ease pain by holding a pillow  against your chest or by placing the palms of both hands over your incisions. This information is not intended to replace advice given to you by your health care provider. Make sure you discuss any questions you have with your health care provider. Document Revised: 05/29/2020 Document Reviewed: 05/30/2020 Elsevier Patient Education  2024 [employer].

## 2023-08-08 NOTE — Anesthesia Procedure Notes (Signed)
Procedure Name: Intubation Date/Time: 08/08/2023 7:45 AM  Performed by: Elliot Dally, CRNAPre-anesthesia Checklist: Patient identified, Emergency Drugs available, Suction available and Patient being monitored Patient Re-evaluated:Patient Re-evaluated prior to induction Oxygen Delivery Method: Circle System Utilized Preoxygenation: Pre-oxygenation with 100% oxygen Induction Type: IV induction Ventilation: Mask ventilation without difficulty Laryngoscope Size: Miller and 2 Grade View: Grade I Tube type: Oral Endobronchial tube: Double lumen EBT and 39 Fr Number of attempts: 1 Airway Equipment and Method: Stylet and Oral airway Placement Confirmation: ETT inserted through vocal cords under direct vision, positive ETCO2 and breath sounds checked- equal and bilateral Tube secured with: Tape Dental Injury: Teeth and Oropharynx as per pre-operative assessment

## 2023-08-08 NOTE — Discharge Summary (Cosign Needed)
[address]       [place] [address]             [phone]    Physician Discharge Summary  Patient ID: Leonard Walton MRN: [id number] DOB/AGE: [birth year] 74 y.o.  Admit date: 08/08/2023 Discharge date: 08/11/2023  Admission Diagnoses:  Patient Active Problem List   Diagnosis Date Noted   Adenocarcinoma of lung, stage 1, right (HCC) 08/10/2023   S/P lobectomy of lung 08/08/2023   Pulmonary nodule 05/16/2023   COPD (chronic obstructive pulmonary disease) (HCC) 05/16/2023   Weakness 02/07/2020   Paresthesia 02/07/2020   Urinary urgency 02/07/2020   Gait abnormality 02/07/2020   Dyspnea 04/11/2015   FASCIITIS, PLANTAR 09/24/2008   RESTLESS LEG SYNDROME 11/17/2007   Hypothyroidism 05/19/2007   HYPERLIPIDEMIA 05/19/2007   LEG PAIN, BILATERAL 05/19/2007     Discharge Diagnoses:  Patient Active Problem List   Diagnosis Date Noted   Adenocarcinoma of lung, stage 1, right (HCC) 08/10/2023   S/P lobectomy of lung 08/08/2023   Pulmonary nodule 05/16/2023   COPD (chronic obstructive pulmonary disease) (HCC) 05/16/2023   Weakness 02/07/2020   Paresthesia 02/07/2020   Urinary urgency 02/07/2020   Gait abnormality 02/07/2020   Dyspnea 04/11/2015   FASCIITIS, PLANTAR 09/24/2008   RESTLESS LEG SYNDROME 11/17/2007   Hypothyroidism 05/19/2007   HYPERLIPIDEMIA 05/19/2007   LEG PAIN, BILATERAL 05/19/2007     Discharged Condition: Stable  HPI: This is a 72 year old man with a past medical history significant for tobacco abuse, COPD, hyperlipidemia, hypothyroidism, type 2 diabetes complicated by diabetic neuropathy, arthritis, depression, and peptic ulcer disease.  He smoked about a pack a day for 50 years.  Currently down to about 1/3 pack/day.   He had a low-dose CT for lung cancer screening in July.  It showed a new 10 mm spiculated central right lower lobe nodule along with centrilobular and paraseptal emphysema.  There was no mediastinal  adenopathy.  He had a robotic bronchoscopy by Dr. Delton Coombes.  It showed non-small cell carcinoma.   As noted he is currently smoking about 1/3 pack/day.  No chest pain, pressure, or tightness.  He does get winded with exertion but can walk up a flight of stairs without stopping and thinks he can go up 2 flights.  Activity is very limited by arthritis in his knees and hips.  No unusual headaches, visual changes, or seizures.  No change in appetite or weight loss.   Dr. Dorris Fetch discussed the treatment options for stage Ia lung cancer. That includes surgical resection and radiation. Dr. Dorris Fetch emphasized that these are not equivalent treatments and that surgical resection is the gold standard. They did discuss the advantages and disadvantages of each. Patient wished to proceed with surgery. Potential risks, benefits,a and complications of the surgery were discussed with the patient and he agreed to proceed.  Hospital Course:  The patient was admitted electively and taken the operating room at which time he underwent a robotic assisted thoracoscopy for right lower lobe lobectomy and lymph node dissection.  He tolerated the procedure well was taken to the postanesthesia care unit in stable condition. A line and foley were removed. Chest tube was to water seal. There was tidling with cough but no true air leak. Daily chest x rays were obtained and remained stable. Chest tube was removed on 11/20. Same day chest x ray showed persistent, small right apical pneumothorax and right base atelectasis. He has been tolerating a diet. He is ambulating  on room air with good oxygenation. All wounds are clean, dry, healing without signs of infection. PA/LAT CXR 11/21 showed **. Of note, patient just received a prescription for Percocet (he has all of pills as has not taken any yet). As a result, he will only get prescription for Gabapentin at discharge. Patient is felt stable for discharge to home.  Consults:  None  Significant Diagnostic Studies:  Narrative & Impression  CLINICAL DATA:  Pneumothorax status post lobectomy.   EXAM: CHEST - 2 VIEW   COMPARISON:  August 10, 2023.   FINDINGS: Stable small right apical pneumothorax is noted. Stable subcutaneous emphysema is seen over right lateral chest wall. Stable right basilar atelectasis is noted. Minimal left basilar subsegmental atelectasis is noted.   IMPRESSION: Stable small right apical pneumothorax is noted with stable subcutaneous emphysema over right lateral chest wall. Bibasilar subsegmental atelectasis is noted, right greater than left.     Electronically Signed   By: Lupita Raider M.D.   On: 08/11/2023 10:53      Treatments: surgery:  Xi robotic assisted right lower lobectomy, lymph node dissection, and intercostal nerve blocks levels 3 through 10 by Dr. Dorris Fetch on 08/08/2023.  Pathology: A. LUNG, RIGHT LOWER LOBE, LOBECTOMY: - Invasive moderately differentiated adenocarcinoma, acinar predominant, 1.1 cm - Visceral pleura is not involved - Resection margins are negative for carcinoma - Lymph node, negative for carcinoma (0/1) - See oncology table  B. LYMPH NODE, LEVEL 9, EXCISION: - Lymph node, negative for carcinoma (0/1)  C. LYMPH NODE, LEVEL 8, EXCISION: - Lymph node, negative for carcinoma (0/1)  D. LYMPH NODE, LEVEL 7, EXCISION: - Lymph node, negative for carcinoma (0/1)  E. LYMPH NODE, LEVEL 7 #2, EXCISION: - Lymph node, negative for carcinoma (0/1)  F. LYMPH NODE, 4R, EXCISION: - Lymph node, negative for carcinoma (0/1)  G. LYMPH NODE, LEVEL 12, EXCISION: - Lymph node, negative for carcinoma (0/1)  H. LYMPH NODE, LEVEL 10, EXCISION: - Lymph node, negative for carcinoma (0/1)  I. LYMPH NODE, LEVEL 11, EXCISION: - Lymph node, negative for carcinoma (0/1)  J. LYMPH NODE, LEVEL 10 #2, EXCISION: - Lymph node, negative for carcinoma (0/1)  K. LYMPH NODE, LEVEL 12 #2, EXCISION: - Lymph  node, negative for carcinoma (0/1)  TNM Code: pT1b, pN0   Discharge Exam: Blood pressure 120/74, pulse (!) 55, temperature 97.9 F (36.6 C), temperature source Oral, resp. rate 16, height 5\' 7"  (1.702 m), weight 81.4 kg, SpO2 95%. Cardiovascular: Slightly bradycardic Pulmonary: Coarse but clears with cough on the right;clear on left Abdomen: Soft, non tender, bowel sounds present. Extremities: No LE edema Wounds: Clean and dry.  No erythema or signs of infection. Minor serous drainage from chest tube wound  Discharge Medications:   Allergies as of 08/11/2023       Reactions   Finasteride Other (See Comments)   dizziness        Medication List     TAKE these medications    albuterol 108 (90 Base) MCG/ACT inhaler Commonly known as: VENTOLIN HFA Inhale 1-2 puffs into the lungs every 6 (six) hours as needed for wheezing or shortness of breath.   aspirin EC 81 MG tablet Take 81 mg by mouth daily.   b complex vitamins capsule Take 1 capsule by mouth daily.   Breztri Aerosphere 160-9-4.8 MCG/ACT Aero Generic drug: Budeson-Glycopyrrol-Formoterol Inhale 2 puffs into the lungs in the morning and at bedtime.   docusate sodium 100 MG capsule Commonly known as: Colace Take  1 capsule (100 mg total) by mouth daily as needed for up to 30 doses.   gabapentin 300 MG capsule Commonly known as: NEURONTIN Take 1 capsule (300 mg total) by mouth at bedtime.   guaiFENesin 600 MG 12 hr tablet Commonly known as: MUCINEX Take 1 tablet (600 mg total) by mouth 2 (two) times daily as needed.   levothyroxine 88 MCG tablet Commonly known as: SYNTHROID Take 88 mcg by mouth daily before breakfast.   Magnesium 250 MG Caps Take 250 mg by mouth daily.   metFORMIN 500 MG tablet Commonly known as: GLUCOPHAGE Take 500 mg by mouth 2 (two) times daily with a meal.   oxyCODONE-acetaminophen 5-325 MG tablet Commonly known as: Percocet Take 1 tablet by mouth every 4 (four) hours as needed  for up to 18 doses for severe pain (pain score 7-10).   rosuvastatin 5 MG tablet Commonly known as: CRESTOR Take 5 mg by mouth daily.   tamsulosin 0.4 MG Caps capsule Commonly known as: FLOMAX Take 0.4 mg by mouth daily.   Vitamin D3 50 MCG (2000 UT) capsule Take 2,000 Units by mouth daily.        Follow-up Information     [facility]. Go on 08/25/2023.   Why: Please arrive by 2:30 pm in order to have a PA/LAT CXR taken PRIOR to office appointment with Dr. Joneen Roach information: [address] [place] [address]        Loreli Slot, MD. Go on 08/25/2023.   Specialty: Cardiothoracic Surgery Why: Appointment time is at 3:45 pm Contact information: [address] [place] [address] [phone]                 Signed:  Elenore Rota 08/12/2023, 10:56 AM

## 2023-08-08 NOTE — H&P (Signed)
[address]       [name] [address]             [phone]      PCP is Mila Palmer, MD Referring Provider is Leslye Peer, MD       Chief Complaint  Patient presents with   Lung Cancer      New patient consultation Bronch 9/16, PFTs 9/9, Chest CT 9/12, PET 10/10      HPI: [name] is sent for consultation regarding a non-small cell carcinoma of the right lower lobe.   Leonard Walton is a 72 year old man with a past medical history significant for tobacco abuse, COPD, hyperlipidemia, hypothyroidism, type 2 diabetes complicated by diabetic neuropathy, arthritis, depression, and peptic ulcer disease.  He smoked about a pack a day for 50 years.  Currently down to about 1/3 pack/day.   He had a low-dose CT for lung cancer screening in July.  It showed a new 10 mm spiculated central right lower lobe nodule along with centrilobular and paraseptal emphysema.  There was no mediastinal adenopathy.  He had a robotic bronchoscopy by Dr. Delton Coombes.  It showed non-small cell carcinoma.   As noted he is currently smoking about 1/3 pack/day.  No chest pain, pressure, or tightness.  He does get winded with exertion but can walk up a flight of stairs without stopping and thinks he can go up 2 flights.  Activity is very limited by arthritis in his knees and hips.  No unusual headaches, visual changes, or seizures.  No change in appetite or weight loss.   Zubrod Score: At the time of surgery this patient's most appropriate activity status/level should be described as: []     0    Normal activity, no symptoms [x]     1    Restricted in physical strenuous activity but ambulatory, able to do out light work []     2    Ambulatory and capable of self care, unable to do work activities, up and about >50 % of waking hours                              []     3    Only limited self care, in bed greater than 50% of waking hours []     4    Completely disabled, no self care, confined to bed or  chair []     5    Moribund         Past Medical History:  Diagnosis Date   Arthritis     BPH (benign prostatic hyperplasia)     Depression     Diabetes mellitus (HCC)     Hyperlipidemia     Hypothyroid     Low back pain      hx - occaional but not often per patient on 06/03/23   Low serum vitamin B12     Numbness      hx  - hands and feet   Pneumonia      x 1 yrs ago   PUD (peptic ulcer disease)                 Past Surgical History:  Procedure Laterality Date   BACK SURGERY   2009    laminectomy - Dr. Gerlene Fee   BRONCHIAL BIOPSY   06/06/2023    Procedure: BRONCHIAL BIOPSIES;  Surgeon: Leslye Peer, MD;  Location:  [facility];  Service: Pulmonary;;   BRONCHIAL BRUSHINGS   06/06/2023    Procedure: BRONCHIAL BRUSHINGS;  Surgeon: Leslye Peer, MD;  Location: [facility] ENDOSCOPY;  Service: Pulmonary;;   BRONCHIAL NEEDLE ASPIRATION BIOPSY   06/06/2023    Procedure: BRONCHIAL NEEDLE ASPIRATION BIOPSIES;  Surgeon: Leslye Peer, MD;  Location: [facility] ENDOSCOPY;  Service: Pulmonary;;   COLONOSCOPY       FIDUCIAL MARKER PLACEMENT   06/06/2023    Procedure: FIDUCIAL MARKER PLACEMENT;  Surgeon: Leslye Peer, MD;  Location: [facility];  Service: Pulmonary;;   KNEE SURGERY Right      x 2   TONSILLECTOMY       UPPER GI ENDOSCOPY                   Family History  Problem Relation Age of Onset   CAD Mother          MI at age 10   Heart attack Mother     Lung cancer Father     Throat cancer Sister     Prostate cancer Brother     Ovarian cancer Sister            Social History Social History  Social History         Tobacco Use   Smoking status: Every Day      Current packs/day: 0.50      Types: E-cigarettes, Cigarettes   Smokeless tobacco: Never   Tobacco comments:      1 pack will last 3-4 days.  Trying to quit.  06/16/2023 hfb  Vaping Use   Vaping status: Former  Substance Use Topics   Alcohol use: Yes      Alcohol/week: 7.0 - 14.0 standard drinks of alcohol       Types: 7 - 14 Shots of liquor per week      Comment: 1-2 drinks per day   Drug use: No              Current Outpatient Medications  Medication Sig Dispense Refill   aspirin EC 81 MG tablet Take 81 mg by mouth daily.       b complex vitamins capsule Take 1 capsule by mouth daily.       Budeson-Glycopyrrol-Formoterol (BREZTRI AEROSPHERE) 160-9-4.8 MCG/ACT AERO Inhale 2 puffs into the lungs in the morning and at bedtime. 5.9 g 11   ciprofloxacin (CIPRO) 500 MG tablet Take 500 mg by mouth 2 (two) times daily.       levothyroxine (SYNTHROID, LEVOTHROID) 75 MCG tablet Take 75 mcg by mouth daily.   0   metFORMIN (GLUCOPHAGE) 500 MG tablet Take 500 mg by mouth 2 (two) times daily with a meal.    5   naproxen sodium (ALEVE) 220 MG tablet Take 220 mg by mouth daily. Tx arthritis       rosuvastatin (CRESTOR) 5 MG tablet Take 5 mg by mouth daily.       tamsulosin (FLOMAX) 0.4 MG CAPS capsule Take 0.4 mg by mouth daily.       VITAMIN D PO Take 2,000 Units by mouth daily.          No current facility-administered medications for this visit.        Allergies  No Known Allergies     Review of Systems  Constitutional:  Positive for fatigue. Negative for activity change, appetite change and unexpected weight change.  HENT:  Positive for trouble swallowing. Negative for voice change.  Eyes:  Negative for visual disturbance.  Respiratory:  Positive for cough, shortness of breath (With exertion) and wheezing (Occasionally at night).   Cardiovascular:  Negative for chest pain and leg swelling.  Gastrointestinal:  Positive for abdominal pain (reflux). Negative for abdominal distention.  Genitourinary:  Negative for difficulty urinating and dysuria.  Musculoskeletal:  Positive for arthralgias, gait problem and joint swelling.  Neurological:  Positive for numbness (Diabetic neuropathy).  Hematological:  Negative for adenopathy. Does not bruise/bleed easily.      BP (!) 145/76 (BP Location: Right  Arm, Patient Position: Sitting, Cuff Size: Normal)   Pulse 66   Resp 20   Ht 5\' 7"  (1.702 m)   Wt 202 lb 9.6 oz (91.9 kg)   SpO2 95%   BMI 31.73 kg/m  Physical Exam Vitals reviewed.  Constitutional:      General: He is not in acute distress.    Appearance: Normal appearance.  HENT:     Head: Normocephalic and atraumatic.  Eyes:     General: No scleral icterus.    Extraocular Movements: Extraocular movements intact.  Neck:     Vascular: No carotid bruit.  Cardiovascular:     Rate and Rhythm: Normal rate and regular rhythm.     Heart sounds: Normal heart sounds. No murmur heard.    No friction rub. No gallop.  Pulmonary:     Effort: Pulmonary effort is normal. No respiratory distress.     Breath sounds: Normal breath sounds. No wheezing.  Abdominal:     General: There is no distension.     Palpations: Abdomen is soft.  Musculoskeletal:     Comments: No clubbing  Lymphadenopathy:     Cervical: No cervical adenopathy.  Skin:    General: Skin is warm and dry.  Neurological:     General: No focal deficit present.     Mental Status: He is alert and oriented to person, place, and time.     Cranial Nerves: No cranial nerve deficit.     Motor: No weakness.     Gait: Gait abnormal.      Diagnostic Tests: CT CHEST WITHOUT CONTRAST   TECHNIQUE: Multidetector CT imaging of the chest was performed using thin slice collimation for electromagnetic bronchoscopy planning purposes, without intravenous contrast.   RADIATION DOSE REDUCTION: This exam was performed according to the departmental dose-optimization program which includes automated exposure control, adjustment of the mA and/or kV according to patient size and/or use of iterative reconstruction technique.   COMPARISON:  04/11/2023   FINDINGS: Cardiovascular: The heart size is normal. No substantial pericardial effusion. Coronary artery calcification is evident. Mild atherosclerotic calcification is noted in the wall  of the thoracic aorta.   Mediastinum/Nodes: No mediastinal lymphadenopathy. No evidence for gross hilar lymphadenopathy although assessment is limited by the lack of intravenous contrast on the current study. The esophagus has normal imaging features. There is no axillary lymphadenopathy.   Lungs/Pleura: 10 mm irregular/spiculated lesion identified in the retro hilar right posterior lung. While possibly a posterior right middle lobe lesion, this is likely in the lower lobe although sagittal imaging shows it projecting along the course of the minor fissure with apparent variant fissural anatomy in the right lower lobe suggesting superior accessory fissure (sagittal image 51 of series 4). 5 mm right upper lobe perifissural nodule on 86/5 is stable. 7 mm left lower lobe nodule also unchanged in the interval. No focal consolidation or pleural effusion.   Upper Abdomen: Visualized portion of the upper  abdomen is unremarkable.   Musculoskeletal: No worrisome lytic or sclerotic osseous abnormality.   IMPRESSION: 1. 10 mm irregular/spiculated lesion in the retro hilar right posterior lung. This is likely in the lower lobe although sagittal imaging shows it projecting along the course of the minor fissure with apparent variant fissural anatomy in the right lower lobe. Imaging features remain worrisome for neoplasm. 2. Additional bilateral pulmonary nodules are stable in the interval. 3.  Aortic Atherosclerosis (ICD10-I70.0).     Electronically Signed   By: Kennith Center M.D.   On: 06/13/2023 14:14 I personally reviewed the CT images.  10 mm spiculated nodule posterior central right lower lobe.  5 mm nodule in the middle lobe, likely subpleural lymph node.  Stable 6 mm nodule left lower lobe.  No mediastinal or hilar adenopathy.  Aortic atherosclerosis.  Mild coronary calcification.   Impression: Leonard Walton is a 72 year old man with a past medical history significant for tobacco  abuse, COPD, hyperlipidemia, hypothyroidism, type 2 diabetes complicated by diabetic neuropathy, arthritis, depression, and peptic ulcer disease.  Found to have a right lower lobe lung nodule on low-dose CT for lung cancer screening.  Biopsy showed non-small cell carcinoma.   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  Obviously we want to see the PET scan but I would be surprised if it has findings that would change staging.   I discussed the treatment options for stage Ia lung cancer.  That includes surgical resection and radiation.  I emphasized that these are not equivalent treatments and that surgical resection is the gold standard.  We did discuss the advantages and disadvantages of each.   I discussed the proposed operative procedure with Mr. and Mrs. Paule.  The plan would be to do a robotic assisted right lower lobectomy.  Anatomy is not favorable for a segmentectomy.  I informed him of the general nature of the procedure including the incisions to be used, the use of the surgical robot, the use of a drainage tube postoperatively, the expected hospital stay, and the overall recovery.  I informed them of the indications, risks, benefits, and alternatives.  They understand the risks include, but not limited to death, MI, DVT, PE, bleeding, possible need for transfusion, infection, prolonged air leak, cardiac arrhythmias, as well as possibility of other unforeseeable complications.   FVC 79% FEV1 75% DLCO 81%  Recent bladder stone removal  Plan: Robotic assisted right lower lobectomy   Loreli Slot, MD [facility] [phone]

## 2023-08-08 NOTE — Anesthesia Postprocedure Evaluation (Signed)
Anesthesia Post Note  Patient: GREGOR REYNER  Procedure(s) Performed: XI ROBOTIC ASSISTED THORACOSCOPY-RIGHT LOWER LOBECTOMY (Right: Chest) INTERCOSTAL NERVE BLOCK (Right: Chest) LYMPH NODE BIOPSY (Right: Chest)     Patient location during evaluation: PACU Anesthesia Type: General Level of consciousness: awake and alert Pain management: pain level controlled Vital Signs Assessment: post-procedure vital signs reviewed and stable Respiratory status: spontaneous breathing, nonlabored ventilation, respiratory function stable and patient connected to nasal cannula oxygen Cardiovascular status: blood pressure returned to baseline and stable Postop Assessment: no apparent nausea or vomiting Anesthetic complications: no  There were no known notable events for this encounter.  Last Vitals:  Vitals:   08/08/23 1500 08/08/23 1530  BP: 131/78 (!) 142/74  Pulse: (!) 52 (!) 49  Resp: 16   Temp:  36.7 C  SpO2: 96%     Last Pain:  Vitals:   08/08/23 1530  TempSrc: Oral  PainSc:                  Kennieth Rad

## 2023-08-08 NOTE — Anesthesia Procedure Notes (Signed)
Arterial Line Insertion Start/End11/18/2024 7:00 AM, 08/08/2023 7:19 AM Performed by: Elliot Dally, CRNA, CRNA  Patient location: Pre-op. Preanesthetic checklist: patient identified, IV checked, site marked, risks and benefits discussed, surgical consent, monitors and equipment checked, pre-op evaluation, timeout performed and anesthesia consent Left, radial was placed Catheter size: 20 G Hand hygiene performed , maximum sterile barriers used  and Seldinger technique used  Attempts: 1 Procedure performed using ultrasound guided technique. Ultrasound Notes:anatomy identified, needle tip was noted to be adjacent to the nerve/plexus identified and no ultrasound evidence of intravascular and/or intraneural injection Following insertion, dressing applied and Biopatch. Post procedure assessment: normal  Patient tolerated the procedure well with no immediate complications.

## 2023-08-08 NOTE — Transfer of Care (Signed)
Immediate Anesthesia Transfer of Care Note  Patient: HANSELL FLUEGEL  Procedure(s) Performed: XI ROBOTIC ASSISTED THORACOSCOPY-RIGHT LOWER LOBECTOMY (Right: Chest) INTERCOSTAL NERVE BLOCK (Right: Chest) LYMPH NODE BIOPSY (Right: Chest)  Patient Location: PACU  Anesthesia Type:General  Level of Consciousness: drowsy and patient cooperative  Airway & Oxygen Therapy: Patient Spontanous Breathing and Patient connected to face mask oxygen  Post-op Assessment: Report given to RN and Post -op Vital signs reviewed and stable  Post vital signs: Reviewed and stable  Last Vitals:  Vitals Value Taken Time  BP 137/67 08/08/23 1106  Temp    Pulse 55 08/08/23 1108  Resp 15 08/08/23 1108  SpO2 98 % 08/08/23 1108  Vitals shown include unfiled device data.  Last Pain:  Vitals:   08/08/23 0622  PainSc: 0-No pain      Patients Stated Pain Goal: 0 (08/08/23 0622)  Complications: There were no known notable events for this encounter.

## 2023-08-09 ENCOUNTER — Inpatient Hospital Stay (HOSPITAL_COMMUNITY): Payer: Medicare Other

## 2023-08-09 ENCOUNTER — Other Ambulatory Visit: Payer: Self-pay

## 2023-08-09 ENCOUNTER — Encounter (HOSPITAL_COMMUNITY): Payer: Self-pay | Admitting: Thoracic Surgery (Cardiothoracic Vascular Surgery)

## 2023-08-09 LAB — CBC
HCT: 39.8 % (ref 39.0–52.0)
Hemoglobin: 13.9 g/dL (ref 13.0–17.0)
MCH: 34.5 pg — ABNORMAL HIGH (ref 26.0–34.0)
MCHC: 34.9 g/dL (ref 30.0–36.0)
MCV: 98.8 fL (ref 80.0–100.0)
Platelets: 217 10*3/uL (ref 150–400)
RBC: 4.03 MIL/uL — ABNORMAL LOW (ref 4.22–5.81)
RDW: 12.7 % (ref 11.5–15.5)
WBC: 17.3 10*3/uL — ABNORMAL HIGH (ref 4.0–10.5)
nRBC: 0 % (ref 0.0–0.2)

## 2023-08-09 LAB — BASIC METABOLIC PANEL
Anion gap: 7 (ref 5–15)
BUN: 8 mg/dL (ref 8–23)
CO2: 24 mmol/L (ref 22–32)
Calcium: 8.6 mg/dL — ABNORMAL LOW (ref 8.9–10.3)
Chloride: 105 mmol/L (ref 98–111)
Creatinine, Ser: 1.05 mg/dL (ref 0.61–1.24)
GFR, Estimated: >60 mL/min (ref >60)
Glucose, Bld: 165 mg/dL — ABNORMAL HIGH (ref 70–99)
Potassium: 4 mmol/L (ref 3.5–5.1)
Sodium: 136 mmol/L (ref 135–145)

## 2023-08-09 LAB — GLUCOSE, CAPILLARY
Glucose-Capillary: 144 mg/dL — ABNORMAL HIGH (ref 70–99)
Glucose-Capillary: 99 mg/dL (ref 70–99)
Glucose-Capillary: 136 mg/dL — ABNORMAL HIGH (ref 70–99)
Glucose-Capillary: 107 mg/dL — ABNORMAL HIGH (ref 70–99)

## 2023-08-09 LAB — SURGICAL PATHOLOGY

## 2023-08-09 MED ORDER — DEXTROSE-SODIUM CHLORIDE 5-0.45 % IV SOLN: INTRAVENOUS | Status: DC

## 2023-08-09 MED ORDER — ORAL CARE MOUTH RINSE: 15.0000 mL | OROMUCOSAL | Status: DC | PRN

## 2023-08-09 NOTE — Progress Notes (Addendum)
      [address]       [name] [address]             [phone]       1 Day Post-Op Procedure(s) (LRB): XI ROBOTIC ASSISTED THORACOSCOPY-RIGHT LOWER LOBECTOMY (Right) INTERCOSTAL NERVE BLOCK (Right) LYMPH NODE BIOPSY (Right)  Subjective: Patient with pain in right shoulder (K pad applied);likely due to chest tube  Objective: Vital signs in last 24 hours: Temp:  [97.8 F (36.6 C)-98.3 F (36.8 C)] 98.3 F (36.8 C) (11/18 1943) Pulse Rate:  [42-59] 42 (11/19 0500) Cardiac Rhythm: Sinus bradycardia (11/18 2120) Resp:  [9-22] 12 (11/19 0500) BP: (108-144)/(65-116) 108/85 (11/19 0500) SpO2:  [93 %-98 %] 93 % (11/19 0500) Arterial Line BP: (122-148)/(47-64) 122/47 (11/18 1145) Weight:  [81.4 kg] 81.4 kg (11/18 1217)     Intake/Output from previous day: 11/18 0701 - 11/19 0700 In: 2131.6 [I.V.:1931.6; IV Piggyback:200] Out: 2316 [Urine:2155; Blood:50; Chest Tube:111]   Physical Exam:  Cardiovascular: Slightly bradycardic Pulmonary: Clear to auscultation on left Abdomen: Soft, non tender, bowel sounds present. Extremities: SCDS in place Wounds: Clean and dry.  No erythema or signs of infection. Chest Tube: to water seal, tidling but no true air leak  Lab Results: CBC: Recent Labs    08/09/23 0241  WBC 17.3*  HGB 13.9  HCT 39.8  PLT 217   BMET:  Recent Labs    08/09/23 0241  NA 136  K 4.0  CL 105  CO2 24  GLUCOSE 165*  BUN 8  CREATININE 1.05  CALCIUM 8.6*    PT/INR: No results for input(s): "LABPROT", "INR" in the last 72 hours. ABG:  INR: Will add last result for INR, ABG once components are confirmed Will add last 4 CBG results once components are confirmed  Assessment/Plan:  1. CV - SB.  2.  Pulmonary - Continue Incruse Ellipta and Dulera. Chest tube with 111 cc of output since surgery. Chest tube is to water seal, tidling but no true air leak. CXR this am appears stable (? Trace right apical pneumothorax, right lateral  chest wall emphysema). ? Remove chest tube. Encourage incentive spirometer. Await final pathology. 3. On Lovenox for DVT prophylaxis 4. DM-CBGs 214/172/144 . On Metformin 500 mg bid. Last HGA1C 6.6 5. History of hypothyroidism-continue Levothyroxine 6. History of BPH-continue Flomax 7. Stop IVF  Donielle M ZimmermanPA-C 08/09/2023,7:15 AM  Patient seen and examined, agree with above He did have an intermittent air leak when I examined him, so will leave CT in place for today Ambulate  Viviann Spare C. Dorris Fetch, MD [facility] [phone]

## 2023-08-09 NOTE — Progress Notes (Signed)
Mobility Specialist Progress Note:   08/09/23 0900  Oxygen Therapy  O2 Device Room Air  Mobility  Activity Ambulated with assistance in hallway  Level of Assistance Contact guard assist, steadying assist  Assistive Device Front wheel walker  Distance Ambulated (ft) 270 ft  Activity Response Tolerated well  Mobility Referral Yes  $Mobility charge 1 Mobility  Mobility Specialist Start Time (ACUTE ONLY) 0907  Mobility Specialist Stop Time (ACUTE ONLY) 0926  Mobility Specialist Time Calculation (min) (ACUTE ONLY) 19 min    Pre Mobility: 51 HR,  95% SpO2 Post Mobility:  55 HR,  95% SpO2  Pt received in bed, agreeable to mobility. Asymptomatic w/ no complaints throughout. Returned to room w/o fault. Pt left in bed with call bell and all needs met.  D'Vante Earlene Plater Mobility Specialist Please contact via Special educational needs teacher or Rehab office at [phone]

## 2023-08-10 ENCOUNTER — Inpatient Hospital Stay (HOSPITAL_COMMUNITY): Payer: Medicare Other

## 2023-08-10 DIAGNOSIS — C3491 Malignant neoplasm of unspecified part of right bronchus or lung: Secondary | ICD-10-CM | POA: Insufficient documentation

## 2023-08-10 LAB — COMPREHENSIVE METABOLIC PANEL
ALT: 13 U/L (ref 0–44)
AST: 14 U/L — ABNORMAL LOW (ref 15–41)
Albumin: 3 g/dL — ABNORMAL LOW (ref 3.5–5.0)
Alkaline Phosphatase: 46 U/L (ref 38–126)
Anion gap: 8 (ref 5–15)
BUN: 11 mg/dL (ref 8–23)
CO2: 23 mmol/L (ref 22–32)
Calcium: 8.5 mg/dL — ABNORMAL LOW (ref 8.9–10.3)
Chloride: 106 mmol/L (ref 98–111)
Creatinine, Ser: 0.99 mg/dL (ref 0.61–1.24)
GFR, Estimated: >60 mL/min (ref >60)
Glucose, Bld: 127 mg/dL — ABNORMAL HIGH (ref 70–99)
Potassium: 3.6 mmol/L (ref 3.5–5.1)
Sodium: 137 mmol/L (ref 135–145)
Total Bilirubin: 0.6 mg/dL (ref <1.2)
Total Protein: 5.8 g/dL — ABNORMAL LOW (ref 6.5–8.1)

## 2023-08-10 LAB — CBC
HCT: 41.2 % (ref 39.0–52.0)
Hemoglobin: 13.8 g/dL (ref 13.0–17.0)
MCH: 33.2 pg (ref 26.0–34.0)
MCHC: 33.5 g/dL (ref 30.0–36.0)
MCV: 99 fL (ref 80.0–100.0)
Platelets: 201 10*3/uL (ref 150–400)
RBC: 4.16 MIL/uL — ABNORMAL LOW (ref 4.22–5.81)
RDW: 12.8 % (ref 11.5–15.5)
WBC: 12.8 10*3/uL — ABNORMAL HIGH (ref 4.0–10.5)
nRBC: 0 % (ref 0.0–0.2)

## 2023-08-10 LAB — GLUCOSE, CAPILLARY
Glucose-Capillary: 99 mg/dL (ref 70–99)
Glucose-Capillary: 149 mg/dL — ABNORMAL HIGH (ref 70–99)
Glucose-Capillary: 166 mg/dL — ABNORMAL HIGH (ref 70–99)
Glucose-Capillary: 107 mg/dL — ABNORMAL HIGH (ref 70–99)

## 2023-08-10 MED ORDER — GUAIFENESIN ER 600 MG PO TB12
600.0000 mg | ORAL_TABLET | Freq: Two times a day (BID) | ORAL | Status: DC
  Administered 2023-08-10 – 2023-08-11 (×3): 600 mg via ORAL
  Filled 2023-08-10 (×3): qty 1

## 2023-08-10 MED ORDER — POTASSIUM CHLORIDE CRYS ER 20 MEQ PO TBCR
40.0000 meq | EXTENDED_RELEASE_TABLET | Freq: Once | ORAL | Status: AC
  Administered 2023-08-10: 40 meq via ORAL
  Filled 2023-08-10: qty 2

## 2023-08-10 NOTE — TOC CM/SW Note (Signed)
Transition of Care [facility]) - Inpatient Brief Assessment   Patient Details  Name: Leonard Walton MRN: [id number] Date of Birth: 11-May-1951  [facility] CM/SW Contact:    Harriet Masson, RN Phone Number: 08/10/2023, 1:25 PM   Clinical Narrative: 2 Days Post-Op Procedure(s) (LRB): XI ROBOTIC ASSISTED THORACOSCOPY-RIGHT LOWER LOBECTOMY. TOC following.    Transition of Care Asessment: Insurance and Status: Insurance coverage has been reviewed Patient has primary care physician: Yes Home environment has been reviewed: safe to discharge home when medically stable Prior level of function:: independent Prior/Current Home Services: No current home services Social Determinants of Health Reivew: SDOH reviewed no interventions necessary Readmission risk has been reviewed: Yes Transition of care needs: no transition of care needs at this time

## 2023-08-10 NOTE — Plan of Care (Signed)
  Problem: Education: Goal: Knowledge of General Education information will improve Description: Including pain rating scale, medication(s)/side effects and non-pharmacologic comfort measures Outcome: Progressing   Problem: Health Behavior/Discharge Planning: Goal: Ability to manage health-related needs will improve Outcome: Progressing   Problem: Clinical Measurements: Goal: Ability to maintain clinical measurements within normal limits will improve Outcome: Progressing Goal: Will remain free from infection Outcome: Progressing Goal: Diagnostic test results will improve Outcome: Progressing Goal: Respiratory complications will improve Outcome: Progressing Goal: Cardiovascular complication will be avoided Outcome: Progressing   Problem: Activity: Goal: Risk for activity intolerance will decrease Outcome: Progressing   Problem: Nutrition: Goal: Adequate nutrition will be maintained Outcome: Progressing   Problem: Coping: Goal: Level of anxiety will decrease Outcome: Progressing   Problem: Elimination: Goal: Will not experience complications related to bowel motility Outcome: Progressing Goal: Will not experience complications related to urinary retention Outcome: Progressing   Problem: Pain Management: Goal: General experience of comfort will improve Outcome: Progressing   Problem: Safety: Goal: Ability to remain free from injury will improve Outcome: Progressing   Problem: Skin Integrity: Goal: Risk for impaired skin integrity will decrease Outcome: Progressing   Problem: Activity: Goal: Risk for activity intolerance will decrease Outcome: Progressing   Problem: Clinical Measurements: Goal: Postoperative complications will be avoided or minimized Outcome: Progressing

## 2023-08-10 NOTE — Progress Notes (Addendum)
      [address]       [name] [address]             [phone]       2 Days Post-Op Procedure(s) (LRB): XI ROBOTIC ASSISTED THORACOSCOPY-RIGHT LOWER LOBECTOMY (Right) INTERCOSTAL NERVE BLOCK (Right) LYMPH NODE BIOPSY (Right)  Subjective: Patient had a lot of coughing yesterday/last night. Having trouble with expectoration. He did have a long walk around the unit.  Objective: Vital signs in last 24 hours: Temp:  [97.6 F (36.4 C)-99.1 F (37.3 C)] 98.2 F (36.8 C) (11/20 0407) Pulse Rate:  [47-87] 59 (11/20 0407) Cardiac Rhythm: Sinus bradycardia (11/20 0422) Resp:  [17-20] 20 (11/20 0407) BP: (99-127)/(56-81) 127/69 (11/20 0407) SpO2:  [89 %-93 %] 91 % (11/20 0407)     Intake/Output from previous day: 11/19 0701 - 11/20 0700 In: 120 [P.O.:120] Out: 890 [Urine:500; Stool:200; Chest Tube:190]   Physical Exam:  Cardiovascular: Slightly bradycardic Pulmonary: Clear to auscultation on left Abdomen: Soft, non tender, bowel sounds present. Extremities: SCDS in place Wounds: Clean and dry.  No erythema or signs of infection. Chest Tube: to water seal, tidling but no true air leak  Lab Results: CBC: Recent Labs    08/09/23 0241 08/10/23 0219  WBC 17.3* 12.8*  HGB 13.9 13.8  HCT 39.8 41.2  PLT 217 201   BMET:  Recent Labs    08/09/23 0241 08/10/23 0219  NA 136 137  K 4.0 3.6  CL 105 106  CO2 24 23  GLUCOSE 165* 127*  BUN 8 11  CREATININE 1.05 0.99  CALCIUM 8.6* 8.5*    PT/INR: No results for input(s): "LABPROT", "INR" in the last 72 hours. ABG:  INR: Will add last result for INR, ABG once components are confirmed Will add last 4 CBG results once components are confirmed  Assessment/Plan:  1. CV - SB.  2.  Pulmonary - On 3 liters of oxygen via  this am;wean as able. Continue Incruse Ellipta and Dulera. Chest tube with 190 cc last 24 hours. Patient's cough not forceful this am but no air leak seen. CXR this am appears stable  (small right apical pneumothorax). ? Remove chest tube. Mucinex bid for cough. Encourage incentive spirometer. Await final pathology. 3. On Lovenox for DVT prophylaxis 4. DM-CBGs 136/107/99 . On Metformin 500 mg bid. Last HGA1C 6.6 5. History of hypothyroidism-continue Levothyroxine 6. History of BPH-continue Flomax 7. Supplement potassium-3.6 8. If chest tube removed today, likely home in am if CXR stable   Donielle M ZimmermanPA-C 08/10/2023,7:10 AM  Patient seen and examined, agree with above No air leak- will dc chest tube Continue ambulation Path - adenocarcinoma pT1b,pN0- stage IA  Salvatore Decent. Dorris Fetch, MD [facility] [phone]

## 2023-08-11 ENCOUNTER — Inpatient Hospital Stay (HOSPITAL_COMMUNITY): Payer: Medicare Other

## 2023-08-11 LAB — BPAM RBC
Blood Product Expiration Date: 202412082359
Blood Product Expiration Date: 202412082359
Unit Type and Rh: 5100
Unit Type and Rh: 5100

## 2023-08-11 LAB — TYPE AND SCREEN
ABO/RH(D): O POS
Antibody Screen: POSITIVE
PT AG Type: NEGATIVE
Unit division: 0
Unit division: 0

## 2023-08-11 LAB — GLUCOSE, CAPILLARY: Glucose-Capillary: 115 mg/dL — ABNORMAL HIGH (ref 70–99)

## 2023-08-11 MED ORDER — GABAPENTIN 300 MG PO CAPS: 300.0000 mg | ORAL_CAPSULE | Freq: Every day | ORAL | 0 refills | Status: DC

## 2023-08-11 MED ORDER — GUAIFENESIN ER 600 MG PO TB12: 600.0000 mg | ORAL_TABLET | Freq: Two times a day (BID) | ORAL | Status: DC | PRN

## 2023-08-11 NOTE — Progress Notes (Addendum)
      [address]       [place] [address]             [phone]       3 Days Post-Op Procedure(s) (LRB): XI ROBOTIC ASSISTED THORACOSCOPY-RIGHT LOWER LOBECTOMY (Right) INTERCOSTAL NERVE BLOCK (Right) LYMPH NODE BIOPSY (Right)  Subjective: Patient had bowel movement. He has no specific complaint this am. He hopes to go home.  Objective: Vital signs in last 24 hours: Temp:  [97.6 F (36.4 C)-98.7 F (37.1 C)] 97.9 F (36.6 C) (11/21 0547) Pulse Rate:  [52-74] 52 (11/21 0547) Cardiac Rhythm: Normal sinus rhythm (11/20 1940) Resp:  [18-24] 22 (11/21 0547) BP: (100-136)/(57-79) 123/73 (11/21 0547) SpO2:  [90 %-97 %] 93 % (11/21 0547)     Intake/Output from previous day: 11/20 0701 - 11/21 0700 In: 120 [P.O.:120] Out: 210 [Urine:200; Chest Tube:10]   Physical Exam:  Cardiovascular: Slightly bradycardic Pulmonary: Coarse but clears with cough on the right;clear on left Abdomen: Soft, non tender, bowel sounds present. Extremities: No LE edema Wounds: Clean and dry.  No erythema or signs of infection. Minor serous drainage from chest tube wound  Lab Results: CBC: Recent Labs    08/09/23 0241 08/10/23 0219  WBC 17.3* 12.8*  HGB 13.9 13.8  HCT 39.8 41.2  PLT 217 201   BMET:  Recent Labs    08/09/23 0241 08/10/23 0219  NA 136 137  K 4.0 3.6  CL 105 106  CO2 24 23  GLUCOSE 165* 127*  BUN 8 11  CREATININE 1.05 0.99  CALCIUM 8.6* 8.5*    PT/INR: No results for input(s): "LABPROT", "INR" in the last 72 hours. ABG:  INR: Will add last result for INR, ABG once components are confirmed Will add last 4 CBG results once components are confirmed  Assessment/Plan:  1. CV - SB, SR.  2.  Pulmonary - On room air. Continue Incruse Ellipta and Dulera. Chest tube removed yesterday.  CXR this am appears stable (small right apical pneumothorax, right lateral chest wall subcutaneous emphysema).  Mucinex bid for cough. Encourage incentive  spirometer.  3. On Lovenox for DVT prophylaxis 4. DM-CBGs 166/107/115 . On Metformin 500 mg bid. Last HGA1C 6.6 5. History of hypothyroidism-continue Levothyroxine 6. History of BPH-continue Flomax 7. Discharge   Donielle M ZimmermanPA-C 08/11/2023,7:11 AM  Patient seen and examined, agree with above Dc home today  Salvatore Decent. Dorris Fetch, MD [facility] [phone]

## 2023-08-11 NOTE — TOC Transition Note (Signed)
Transition of Care [facility]) - CM/SW Discharge Note   Patient Details  Name: Leonard Walton MRN: [id number] Date of Birth: 1950-10-23  Transition of Care [facility]) CM/SW Contact:  Harriet Masson, RN Phone Number: 08/11/2023, 9:13 AM   Clinical Narrative:    Patient stable for discharge.  No TOC needs at this time.     Final next level of care: Home/Self Care Barriers to Discharge: Barriers Resolved   Patient Goals and CMS Choice    Return home  Discharge Placement                   home      Discharge Plan and Services Additional resources added to the After Visit Summary for                                       Social Determinants of Health (SDOH) Interventions SDOH Screenings   Food Insecurity: No Food Insecurity (08/08/2023)  Housing: Low Risk  (08/08/2023)  Transportation Needs: No Transportation Needs (08/08/2023)  Utilities: Not At Risk (08/08/2023)  Tobacco Use: High Risk (08/08/2023)     Readmission Risk Interventions    08/11/2023    9:13 AM  Readmission Risk Prevention Plan  Transportation Screening Complete  PCP or Specialist Appt within 5-7 Days Complete  Home Care Screening Complete  Medication Review (RN CM) Complete

## 2023-08-11 NOTE — Plan of Care (Signed)
  Problem: Education: Goal: Knowledge of General Education information will improve Description: Including pain rating scale, medication(s)/side effects and non-pharmacologic comfort measures Outcome: Progressing   Problem: Health Behavior/Discharge Planning: Goal: Ability to manage health-related needs will improve Outcome: Progressing   Problem: Clinical Measurements: Goal: Ability to maintain clinical measurements within normal limits will improve Outcome: Progressing Goal: Will remain free from infection Outcome: Progressing Goal: Diagnostic test results will improve Outcome: Progressing Goal: Respiratory complications will improve Outcome: Progressing Goal: Cardiovascular complication will be avoided Outcome: Progressing   Problem: Activity: Goal: Risk for activity intolerance will decrease Outcome: Progressing   Problem: Nutrition: Goal: Adequate nutrition will be maintained Outcome: Progressing   Problem: Coping: Goal: Level of anxiety will decrease Outcome: Progressing   Problem: Elimination: Goal: Will not experience complications related to bowel motility Outcome: Progressing Goal: Will not experience complications related to urinary retention Outcome: Progressing   Problem: Pain Management: Goal: General experience of comfort will improve Outcome: Progressing   Problem: Safety: Goal: Ability to remain free from injury will improve Outcome: Progressing   Problem: Skin Integrity: Goal: Risk for impaired skin integrity will decrease Outcome: Progressing   Problem: Education: Goal: Knowledge of disease or condition will improve Outcome: Progressing Goal: Knowledge of the prescribed therapeutic regimen will improve Outcome: Progressing   Problem: Activity: Goal: Risk for activity intolerance will decrease Outcome: Progressing   Problem: Cardiac: Goal: Will achieve and/or maintain hemodynamic stability Outcome: Progressing   Problem: Clinical  Measurements: Goal: Postoperative complications will be avoided or minimized Outcome: Progressing   Problem: Respiratory: Goal: Respiratory status will improve Outcome: Progressing   Problem: Pain Management: Goal: Pain level will decrease Outcome: Progressing   Problem: Skin Integrity: Goal: Wound healing without signs and symptoms infection will improve Outcome: Progressing   Problem: Education: Goal: Ability to describe self-care measures that may prevent or decrease complications (Diabetes Survival Skills Education) will improve Outcome: Progressing Goal: Individualized Educational Video(s) Outcome: Progressing   Problem: Coping: Goal: Ability to adjust to condition or change in health will improve Outcome: Progressing   Problem: Fluid Volume: Goal: Ability to maintain a balanced intake and output will improve Outcome: Progressing   Problem: Health Behavior/Discharge Planning: Goal: Ability to identify and utilize available resources and services will improve Outcome: Progressing Goal: Ability to manage health-related needs will improve Outcome: Progressing   Problem: Metabolic: Goal: Ability to maintain appropriate glucose levels will improve Outcome: Progressing   Problem: Nutritional: Goal: Maintenance of adequate nutrition will improve Outcome: Progressing Goal: Progress toward achieving an optimal weight will improve Outcome: Progressing   Problem: Skin Integrity: Goal: Risk for impaired skin integrity will decrease Outcome: Progressing   Problem: Tissue Perfusion: Goal: Adequacy of tissue perfusion will improve Outcome: Progressing

## 2023-08-11 NOTE — Plan of Care (Signed)
  Problem: Education: Goal: Knowledge of General Education information will improve Description: Including pain rating scale, medication(s)/side effects and non-pharmacologic comfort measures Outcome: Progressing   Problem: Health Behavior/Discharge Planning: Goal: Ability to manage health-related needs will improve Outcome: Progressing   Problem: Clinical Measurements: Goal: Ability to maintain clinical measurements within normal limits will improve Outcome: Progressing Goal: Will remain free from infection Outcome: Progressing Goal: Diagnostic test results will improve Outcome: Progressing Goal: Respiratory complications will improve Outcome: Progressing Goal: Cardiovascular complication will be avoided Outcome: Progressing   Problem: Activity: Goal: Risk for activity intolerance will decrease Outcome: Progressing   Problem: Nutrition: Goal: Adequate nutrition will be maintained Outcome: Progressing   Problem: Coping: Goal: Level of anxiety will decrease Outcome: Progressing   Problem: Elimination: Goal: Will not experience complications related to bowel motility Outcome: Progressing Goal: Will not experience complications related to urinary retention Outcome: Progressing   Problem: Pain Management: Goal: General experience of comfort will improve Outcome: Progressing   Problem: Safety: Goal: Ability to remain free from injury will improve Outcome: Progressing   Problem: Skin Integrity: Goal: Risk for impaired skin integrity will decrease Outcome: Progressing   Problem: Education: Goal: Knowledge of disease or condition will improve Outcome: Progressing Goal: Knowledge of the prescribed therapeutic regimen will improve Outcome: Progressing   Problem: Activity: Goal: Risk for activity intolerance will decrease Outcome: Progressing   Problem: Cardiac: Goal: Will achieve and/or maintain hemodynamic stability Outcome: Progressing   Problem: Clinical  Measurements: Goal: Postoperative complications will be avoided or minimized Outcome: Progressing   Problem: Respiratory: Goal: Respiratory status will improve Outcome: Progressing   Problem: Pain Management: Goal: Pain level will decrease Outcome: Progressing

## 2023-08-11 NOTE — Care Management Important Message (Signed)
Important Message  Patient Details  Name: Leonard Walton MRN: [id number] Date of Birth: 04-25-1951   Important Message Given:  Yes - Medicare IM  Patient discharge prior to IM delivery will mail a copy to the patient home address.    [name] 08/11/2023, 1:18 PM

## 2023-08-11 NOTE — Progress Notes (Signed)
Patient provided with verbal discharge instructions. Paper copy of discharge provided to patient. Spoujse Tracey at beside for d/c instructions. RN answered all questions. VSS at discharge. IV removed. Patient belongings sent with patient. Patient dc'd via wheelchair to private vehicle

## 2023-08-17 DIAGNOSIS — R3912 Poor urinary stream: Secondary | ICD-10-CM | POA: Diagnosis not present

## 2023-08-17 DIAGNOSIS — N210 Calculus in bladder: Secondary | ICD-10-CM | POA: Diagnosis not present

## 2023-08-17 DIAGNOSIS — D414 Neoplasm of uncertain behavior of bladder: Secondary | ICD-10-CM | POA: Diagnosis not present

## 2023-08-24 ENCOUNTER — Other Ambulatory Visit: Payer: Self-pay | Admitting: Thoracic Surgery (Cardiothoracic Vascular Surgery)

## 2023-08-24 DIAGNOSIS — C3431 Malignant neoplasm of lower lobe, right bronchus or lung: Secondary | ICD-10-CM

## 2023-08-25 ENCOUNTER — Ambulatory Visit
Admission: RE | Admit: 2023-08-25 | Discharge: 2023-08-25 | Disposition: A | Discharge status: Home / Self Care | Payer: Medicare Other | Source: Ambulatory Visit | Attending: Thoracic Surgery (Cardiothoracic Vascular Surgery) | Admitting: Thoracic Surgery (Cardiothoracic Vascular Surgery)

## 2023-08-25 ENCOUNTER — Ambulatory Visit (INDEPENDENT_AMBULATORY_CARE_PROVIDER_SITE_OTHER): Payer: Self-pay | Admitting: Thoracic Surgery (Cardiothoracic Vascular Surgery)

## 2023-08-25 ENCOUNTER — Encounter: Payer: Self-pay | Admitting: Thoracic Surgery (Cardiothoracic Vascular Surgery)

## 2023-08-25 ENCOUNTER — Other Ambulatory Visit: Payer: Self-pay

## 2023-08-25 ENCOUNTER — Ambulatory Visit: Payer: Medicare Other | Admitting: Thoracic Surgery (Cardiothoracic Vascular Surgery)

## 2023-08-25 VITALS — BP 147/80 | HR 62 | Resp 20 | Ht 67.0 in | Wt 197.0 lb

## 2023-08-25 DIAGNOSIS — J90 Pleural effusion, not elsewhere classified: Secondary | ICD-10-CM | POA: Diagnosis not present

## 2023-08-25 DIAGNOSIS — C3431 Malignant neoplasm of lower lobe, right bronchus or lung: Secondary | ICD-10-CM

## 2023-08-25 DIAGNOSIS — J984 Other disorders of lung: Secondary | ICD-10-CM | POA: Diagnosis not present

## 2023-08-25 DIAGNOSIS — R0602 Shortness of breath: Secondary | ICD-10-CM | POA: Diagnosis not present

## 2023-08-25 NOTE — Progress Notes (Signed)
[address]       [place] [address]             [phone]     HPI: [name] returns for a scheduled follow-up after recent right lower lobectomy.  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  He was found to have a right lower lobe lung nodule on a low-dose CT for lung cancer screening.  Robotic bronchoscopy by Dr. Delton Coombes showed non-small cell carcinoma.   I did a robotic assisted right lower lobectomy on 08/08/2023.  Final pathology was a T1b, N0, stage Ia adenocarcinoma.  His postoperative course was unremarkable and he went home on day 3.  He has some discomfort and numbness in the lower right chest/upper abdomen.  Not taking any narcotics for that.  He is using gabapentin.  Primary complaint is pain and numbness in the lateral aspect of his left thigh.  He is taking Aleve twice a day for that.  Is not using narcotics.  He has a 50-pack-year history of smoking.  He was down to about one third of a pack a day prior to surgery.  Quit the day before surgery.  Still has cravings.  Past Medical History:  Diagnosis Date   Alcohol abuse    hx acute alcoholism,  per pcp note in care everywhere pt has cut down but not quit   B12 deficiency    Benign localized prostatic hyperplasia with lower urinary tract symptoms (LUTS)    urologist--- dr gay   Bladder calculus    Cigarette nicotine dependence    COPD (chronic obstructive pulmonary disease) (HCC)    pulmonolgy --- dr byrum   History of Helicobacter pylori infection 2015   treated   Hyperlipidemia, mixed    Hypothyroidism    followed pcp   Lesion of urinary bladder    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   OA (osteoarthritis)    knees/ hands/  shoulders/ elbows/  hips   Type 2 diabetes mellitus (HCC)    followed by pcp    (07-27-2023  pt stated checks blood sugar daily am fasting,  average blood sugar 100--120s)   Vitamin D deficiency    Wears dentures    upper full   Wears glasses       Current Outpatient Medications  Medication Sig Dispense Refill   albuterol (VENTOLIN HFA) 108 (90 Base) MCG/ACT inhaler Inhale 1-2 puffs into the lungs every 6 (six) hours as needed for wheezing or shortness of breath.     aspirin EC 81 MG tablet Take 81 mg by mouth daily.     b complex vitamins capsule Take 1 capsule by mouth daily.     Budeson-Glycopyrrol-Formoterol (BREZTRI AEROSPHERE) 160-9-4.8 MCG/ACT AERO Inhale 2 puffs into the lungs in the morning and at bedtime. 5.9 g 11   Cholecalciferol (VITAMIN D3) 50 MCG (2000 UT) capsule Take 2,000 Units by mouth daily.     docusate sodium (COLACE) 100 MG capsule Take 1 capsule (100 mg total) by mouth daily as needed for up to 30 doses. 30 capsule 0   gabapentin (NEURONTIN) 300 MG capsule Take 1 capsule (300 mg total) by mouth at bedtime. 30 capsule 0   guaiFENesin (MUCINEX) 600 MG 12 hr tablet  Take 1 tablet (600 mg total) by mouth 2 (two) times daily as needed.     levothyroxine (SYNTHROID) 88 MCG tablet Take 88 mcg by mouth daily before breakfast.  0   Magnesium 250 MG CAPS Take 250 mg by mouth daily.     metFORMIN (GLUCOPHAGE) 500 MG tablet Take 500 mg by mouth 2 (two) times daily with a meal.  5   oxyCODONE-acetaminophen (PERCOCET) 5-325 MG tablet Take 1 tablet by mouth every 4 (four) hours as needed for up to 18 doses for severe pain (pain score 7-10). 18 tablet 0   rosuvastatin (CRESTOR) 5 MG tablet Take 5 mg by mouth daily.     tamsulosin (FLOMAX) 0.4 MG CAPS capsule Take 0.4 mg by mouth daily.     No current facility-administered medications for this visit.    Physical Exam BP (!) 147/80   Pulse 62   Resp 20   Ht 5\' 7"  (1.702 m)   Wt 197 lb (89.4 kg)   SpO2 96% Comment: RA  BMI  30.3 kg/m  72 year old man in no acute distress Alert and oriented x 3 with no focal deficits Lungs diminished at right base but otherwise clear Incisions clean dry and intact Cardiac regular rate and rhythm Pain with right hip flexion  Diagnostic Tests: I personally reviewed his chest x-ray.  Shows postoperative changes with a small loculated effusion.  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  Stage Ia adenocarcinoma right lower lobe-status post lobectomy.  Does not need adjuvant therapy.  Will refer to Dr. Arbutus Ped for counseling and follow-up.  Status post right lower lobectomy-has typical intercostal neuralgia with discomfort and numbness in the right costal margin region.  Not requiring narcotics.  Should improve with time.  Left thigh pain-symptoms suspicious for sciatica.  He has history of arthritis and could be due to arthritic changes in the hip.  Also possibly could be lumbar disc disease.  Time with surgery suggests could potentially be related to positioning.  If so, should improve with time.  He has an appointment with Dr. Paulino Rily next week to further evaluate that.  Tobacco abuse-has not smoked but feels like he has to have a cigarette in his hands.  Would avoid driving or heavy physical activity until pain improves  Plan: Refer to Dr. Arbutus Ped Return in 6 weeks with PA lateral chest x-ray  Leonard Slot, MD [facility] [phone]

## 2023-08-26 NOTE — Progress Notes (Signed)
The proposed treatment discussed in conference is for discussion purpose only and is not a binding recommendation.  The patients have not been physically examined, or presented with their treatment options.  Therefore, final treatment plans cannot be decided.  

## 2023-09-02 ENCOUNTER — Ambulatory Visit: Payer: Medicare Other | Admitting: Emergency Medicine

## 2023-09-02 ENCOUNTER — Encounter: Payer: Self-pay | Admitting: Emergency Medicine

## 2023-09-02 VITALS — BP 139/84 | HR 62 | Temp 98.4°F | Ht 67.0 in | Wt 196.0 lb

## 2023-09-02 DIAGNOSIS — R911 Solitary pulmonary nodule: Secondary | ICD-10-CM | POA: Diagnosis not present

## 2023-09-02 DIAGNOSIS — C3491 Malignant neoplasm of unspecified part of right bronchus or lung: Secondary | ICD-10-CM | POA: Diagnosis not present

## 2023-09-02 DIAGNOSIS — J449 Chronic obstructive pulmonary disease, unspecified: Secondary | ICD-10-CM | POA: Diagnosis not present

## 2023-09-02 NOTE — Progress Notes (Signed)
Subjective:    Patient ID: Leonard Walton, male    DOB: 11-09-1950, 72 y.o.   MRN: [id number]  HPI Leonard Walton is a 2, active smoker (50+ pack years) with a history of hypothyroidism, diabetes, GERD with PUD, depression.  He is referred today for evaluation of pulmonary nodule noted on lung cancer screening CT. He does not have a formal dx COPD, ut he does have exertional SOB w stairs, etc. He has been tried on [place], believes that he has benefited - less wheeze, better exertional tolerance. Rare albuterol. Used to have bronchitis about once a year.   Lung cancer screening CT chest 04/11/2023 reviewed by me shows some centrilobular and paraseptal emphysema, no mediastinal adenopathy, a new 10 mm irregular spiculated nodule in the posterior right lower lobe (versus superior accessory fissure, anatomical variant). 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 Dorris Fetch 07/2023.  He also had a left lower lobe nodule that was negative on bronchoscopy that will need to be followed.  He has been started on Anthony and reports that he is breathing well. His voice has been weaker since sgy. His exertional tolerance is a bit lower - can get sob with shopping. No albuterol use. No real cough except at night when he lays down. No overt GERD sx.   CT chest April   Review of Systems As per HPI  Past Medical History:  Diagnosis Date   Alcohol abuse    hx acute alcoholism,  per pcp note in care everywhere pt has cut down but not quit   B12 deficiency    Benign localized prostatic hyperplasia with lower urinary tract symptoms (LUTS)    urologist--- dr gay   Bladder calculus    Cigarette nicotine dependence    COPD (chronic obstructive pulmonary disease) (HCC)    pulmonolgy --- dr Brolin Dambrosia   History of  Helicobacter pylori infection 2015   treated   Hyperlipidemia, mixed    Hypothyroidism    followed pcp   Lesion of urinary bladder    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   OA (osteoarthritis)    knees/ hands/ shoulders/ elbows/  hips   Type 2 diabetes mellitus (HCC)    followed by pcp    (07-27-2023  pt stated checks blood sugar daily am fasting,  average blood sugar 100--120s)   Vitamin D deficiency    Wears dentures    upper full   Wears glasses      Family History  Problem Relation Age of Onset   CAD Mother        MI at age 83   Heart attack Mother    Lung cancer Father    Throat cancer Sister    Prostate cancer Brother    Ovarian cancer Sister      Social History   Socioeconomic History   Marital status: Divorced    Spouse name: Not on file   Number of children: 3   Years of education: college   Highest education level: Associate degree: academic program  Occupational History   Occupation: retired from [employer] pilot  Tobacco Use   Smoking status: Every Day    Current packs/day: 1.00    Average packs/day: 1 pack/day for 56.9 years (56.9 ttl pk-yrs)  Types: Cigarettes    Start date: 1968   Smokeless tobacco: Never   Tobacco comments:    07-27-2023  Trying to quit since 09/ 2024  stated age 33,  previously 1.5 ppd,  pt stated down to as of today 3 cig per day  Vaping Use   Vaping status: Former   Devices: 07-27-2023  per pt tried for 3 wks approx  2020 e-cig , none since  Substance and Sexual Activity   Alcohol use: Yes    Alcohol/week: 10.0 - 14.0 standard drinks of alcohol    Types: 10 - 14 Shots of liquor per week    Comment: 07-27-2023 per pt 2 per day, bourbon/ coke,  average 10-14   Drug use: Never   Sexual activity: Yes  Other Topics Concern   Not on file  Social History Narrative   Lives alone.   Right-handed.   Occasional soda.   Social Drivers of Corporate investment banker Strain: Not on file   Food Insecurity: No Food Insecurity (08/08/2023)   Hunger Vital Sign    Worried About Running Out of Food in the Last Year: Never true    Ran Out of Food in the Last Year: Never true  Transportation Needs: No Transportation Needs (08/08/2023)   PRAPARE - Administrator, Civil Service (Medical): No    Lack of Transportation (Non-Medical): No  Physical Activity: Not on file  Stress: Not on file  Social Connections: Not on file  Intimate Partner Violence: Not At Risk (08/08/2023)   Humiliation, Afraid, Rape, and Kick questionnaire    Fear of Current or Ex-Partner: No    Emotionally Abused: No    Physically Abused: No    Sexually Abused: No     Allergies  Allergen Reactions   Finasteride Other (See Comments)    dizziness     Outpatient Medications Prior to Visit  Medication Sig Dispense Refill   albuterol (VENTOLIN HFA) 108 (90 Base) MCG/ACT inhaler Inhale 1-2 puffs into the lungs every 6 (six) hours as needed for wheezing or shortness of breath.     aspirin EC 81 MG tablet Take 81 mg by mouth daily.     b complex vitamins capsule Take 1 capsule by mouth daily.     Budeson-Glycopyrrol-Formoterol (BREZTRI AEROSPHERE) 160-9-4.8 MCG/ACT AERO Inhale 2 puffs into the lungs in the morning and at bedtime. 5.9 g 11   Cholecalciferol (VITAMIN D3) 50 MCG (2000 UT) capsule Take 2,000 Units by mouth daily.     docusate sodium (COLACE) 100 MG capsule Take 1 capsule (100 mg total) by mouth daily as needed for up to 30 doses. 30 capsule 0   gabapentin (NEURONTIN) 300 MG capsule Take 1 capsule (300 mg total) by mouth at bedtime. 30 capsule 0   guaiFENesin (MUCINEX) 600 MG 12 hr tablet Take 1 tablet (600 mg total) by mouth 2 (two) times daily as needed.     levothyroxine (SYNTHROID) 88 MCG tablet Take 88 mcg by mouth daily before breakfast.  0   Magnesium 250 MG CAPS Take 250 mg by mouth daily.     metFORMIN (GLUCOPHAGE) 500 MG tablet Take 500 mg by mouth 2 (two) times daily with a meal.   5   naproxen sodium (ALEVE) 220 MG tablet Take 220 mg by mouth.     oxyCODONE-acetaminophen (PERCOCET) 5-325 MG tablet Take 1 tablet by mouth every 4 (four) hours as needed for up to 18 doses for severe pain (pain score 7-10).  18 tablet 0   rosuvastatin (CRESTOR) 5 MG tablet Take 5 mg by mouth daily.     tamsulosin (FLOMAX) 0.4 MG CAPS capsule Take 0.4 mg by mouth daily.     No facility-administered medications prior to visit.        Objective:   Physical Exam Vitals:   09/02/23 1035  BP: 139/84  Pulse: 62  Temp: 98.4 F (36.9 C)  TempSrc: Oral  SpO2: 98%  Weight: 196 lb (88.9 kg)  Height: 5\' 7"  (1.702 m)   Gen: Pleasant, well-nourished, in no distress,  normal affect  ENT: No lesions,  mouth clear,  oropharynx clear, no postnasal drip  Neck: No JVD, no stridor  Lungs: No use of accessory muscles, no crackles or wheezing on normal respiration, no wheeze on forced expiration  Cardiovascular: RRR, heart sounds normal, no murmur or gallops, no peripheral edema  Musculoskeletal: No deformities, no cyanosis or clubbing  Neuro: alert, awake, non focal  Skin: Warm, no lesions or rash      Assessment & Plan:  COPD (chronic obstructive pulmonary disease) (HCC) Please continue your Breztri 2 puffs twice a day.  Rinse and gargle after using. Keep albuterol available to use 2 puffs up to every 4 hours if needed for shortness of breath, chest tightness, wheezing.   Adenocarcinoma of lung, stage 1, right (HCC) Doing well post lobectomy and following with Dr. Dorris Fetch.  The left lower lobe pulmonary nodule was negative on bronchoscopy, will continue to follow with surveillance CTs.  We will plan to repeat your CT scan of the chest in April 2025. Follow Dr. Dorris Fetch as planned Follow Dr. Delton Coombes in April after your CT so we can review those results together  Pulmonary nodule His adenocarcinoma has been resected.  The left lower lobe nodule will need continued surveillance,  was negative on his bronchoscopy.  Repeat CT ordered for April    Levy Pupa, MD, PhD 09/02/2023, 11:04 AM [facility] [phone] or if no answer before 7:00PM call [phone] For any issues after 7:00PM please call eLink [phone]

## 2023-09-02 NOTE — Patient Instructions (Signed)
Please continue your Breztri 2 puffs twice a day.  Rinse and gargle after using. Keep albuterol available to use 2 puffs up to every 4 hours if needed for shortness of breath, chest tightness, wheezing.  We will plan to repeat your CT scan of the chest in April 2025. Follow Dr. Dorris Fetch as planned Follow Dr. Delton Coombes in April after your CT so we can review those results together

## 2023-09-02 NOTE — Assessment & Plan Note (Signed)
Doing well post lobectomy and following with Dr. Dorris Fetch.  The left lower lobe pulmonary nodule was negative on bronchoscopy, will continue to follow with surveillance CTs.  We will plan to repeat your CT scan of the chest in April 2025. Follow Dr. Dorris Fetch as planned Follow Dr. Delton Coombes in April after your CT so we can review those results together

## 2023-09-02 NOTE — Assessment & Plan Note (Signed)
His adenocarcinoma has been resected.  The left lower lobe nodule will need continued surveillance, was negative on his bronchoscopy.  Repeat CT ordered for April

## 2023-09-02 NOTE — Assessment & Plan Note (Signed)
Please continue your Breztri 2 puffs twice a day.  Rinse and gargle after using. Keep albuterol available to use 2 puffs up to every 4 hours if needed for shortness of breath, chest tightness, wheezing.

## 2023-09-12 DIAGNOSIS — E1122 Type 2 diabetes mellitus with diabetic chronic kidney disease: Secondary | ICD-10-CM | POA: Diagnosis not present

## 2023-09-12 DIAGNOSIS — Z902 Acquired absence of lung [part of]: Secondary | ICD-10-CM | POA: Diagnosis not present

## 2023-09-12 DIAGNOSIS — Z79899 Other long term (current) drug therapy: Secondary | ICD-10-CM | POA: Diagnosis not present

## 2023-09-12 DIAGNOSIS — Z23 Encounter for immunization: Secondary | ICD-10-CM | POA: Diagnosis not present

## 2023-09-12 DIAGNOSIS — C3491 Malignant neoplasm of unspecified part of right bronchus or lung: Secondary | ICD-10-CM | POA: Diagnosis not present

## 2023-09-29 ENCOUNTER — Other Ambulatory Visit: Payer: Self-pay | Admitting: Thoracic Surgery (Cardiothoracic Vascular Surgery)

## 2023-09-29 DIAGNOSIS — C3431 Malignant neoplasm of lower lobe, right bronchus or lung: Secondary | ICD-10-CM

## 2023-10-01 NOTE — Progress Notes (Signed)
 [facility] Telephone:([phone]) [phone]   Fax:([phone]) [phone]  CONSULT NOTE  REFERRING PHYSICIAN: Dr. Kerrin   REASON FOR CONSULTATION:  Adenocarcinoma   HPI Leonard Walton is a 73 y.o. male with a past medical history significant for tobacco abuse, COPD, hyperlipidemia, hypothyroidism, type 2 diabetes complicated by diabetic neuropathy, arthritis, CKD, depression, and peptic ulcer disease is referred to the clinic for lung cancer.   The patient's PCP arrange for a low-dose lung cancer CT scan which was performed on 04/11/2023 which showed a lung RADS 4B due to a new suspicious 10.3 mm irregular spiculated right lower lobe nodule.  The patient was subsequently referred to pulmonary medicine to see Dr. Shelah.  Dr. Shelah arrange for navigational bronchoscopy on 05/13/2023.  The final cytology ([id number]) showed non-small cell lung cancer, favoring adenocarcinoma.  The patient subsequently had a super D CT scan on 06/02/2023 showing 10 mm irregular/spiculated lesion in the retrohilar right posterior lung.  He had a PET scan to complete the staging workup on 06/30/2023 showing 9 mm right lower lobe nodule corresponding to the patient's known primary bronchogenic carcinoma.  There is no evidence of any metastatic disease.  The patient was referred to Dr. Kerrin.  The patient had a robotic assisted right lower lobectomy with lymph node dissection on 08/08/2023.  The final pathology ([id number]) showed moderately differentiated adenocarcinoma, acinar predominant.  This measured 1.1 cm. The final stage was pT1b, PN 0  Patient saw Byrum who arranged for super D CT scan on 12/27/23 to monitor a left lower lobe nodule (biopsy negative).  Since his surgery, the patient is feeling fairly well.  He sometimes loses his voice now and then.  He does have a mild cough.  He takes gabapentin  for surgical pain.  He reports baseline dyspnea exertion for which he takes Breztri  twice a  day and has an albuterol  rescue inhaler, although he has not needed to take this in some time.  His appetite is stable.  Denies any hemoptysis.  Denies any nausea, vomiting, or diarrhea.  He sometimes has constipation intermittently for which she takes a stool softener.  He also takes magnesium.  His family history consist of a mother who had quadruple bypass/heart disease.  The patient's father had small cell lung cancer.  The patient sister had throat cancer.  He had another sister with ovarian cancer and a brother with bladder cancer.  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  He is accompanied by his ex-wife today.  The patient has smoked 50+ years.  He averaged 1 pack of cigarettes per day for most of his life.  He does enjoy bourbon and coke and drinks approximately 2 alcoholic beverages per day on 6 days a week.  HPI  Past Medical History:  Diagnosis Date   Alcohol abuse    hx acute alcoholism,  per pcp note in care everywhere pt has cut down but not quit   B12 deficiency    Benign localized prostatic hyperplasia with lower urinary tract symptoms (LUTS)    urologist--- dr gay   Bladder calculus    Cigarette nicotine dependence    COPD (chronic obstructive pulmonary disease) (HCC)    pulmonolgy --- dr byrum   History of Helicobacter pylori infection 2015   treated   Hyperlipidemia, mixed    Hypothyroidism    followed pcp   Lesion of urinary bladder  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   OA (osteoarthritis)    knees/ hands/ shoulders/ elbows/  hips   Type 2 diabetes mellitus (HCC)    followed by pcp    (07-27-2023  pt stated checks blood sugar daily am fasting,  average blood sugar 100--120s)   Vitamin D deficiency    Wears dentures    upper full   Wears glasses     Past Surgical History:  Procedure Laterality Date   BRONCHIAL  BIOPSY  06/06/2023   Procedure: BRONCHIAL BIOPSIES;  Surgeon: Shelah Lamar RAMAN, MD;  Location: [facility] ENDOSCOPY;  Service: Pulmonary;;   BRONCHIAL BRUSHINGS  06/06/2023   Procedure: BRONCHIAL BRUSHINGS;  Surgeon: Shelah Lamar RAMAN, MD;  Location[facility] ENDOSCOPY;  Service: Pulmonary;;   BRONCHIAL NEEDLE ASPIRATION BIOPSY  06/06/2023   Procedure: BRONCHIAL NEEDLE ASPIRATION BIOPSIES;  Surgeon: Shelah Lamar RAMAN, MD;  Location: [facility] ENDOSCOPY;  Service: Pulmonary;;   COLONOSCOPY  2023   CYSTOSCOPY WITH BIOPSY N/A 08/01/2023   Procedure: CYSTOSCOPY WITH BLADDER BIOPSY, URETHERAl DILATION AND FULGURATION OF PROSTATE;  Surgeon: Selma Donnice SAUNDERS, MD;  Location: [facility];  Service: Urology;  Laterality: N/A;  60 MINUTES NEEDED FOR CASE   CYSTOSCOPY WITH LITHOLAPAXY N/A 08/01/2023   Procedure: CYSTOLITHOLAPAXY;  Surgeon: Selma Donnice SAUNDERS, MD;  Location: [facility];  Service: Urology;  Laterality: N/A;   FIDUCIAL MARKER PLACEMENT  06/06/2023   Procedure: FIDUCIAL MARKER PLACEMENT;  Surgeon: Shelah Lamar RAMAN, MD;  Location: [facility] ENDOSCOPY;  Service: Pulmonary;;   INTERCOSTAL NERVE BLOCK Right 08/08/2023   Procedure: INTERCOSTAL NERVE BLOCK;  Surgeon: Kerrin Elspeth BROCKS, MD;  Location: [facility] OR;  Service: Thoracic;  Laterality: Right;   KNEE SURGERY Right    1966  and 1970s   LUMBAR LAMINECTOMY  10/19/2007   @[facility]  by  Dr. Carles;  L3--5   LYMPH NODE BIOPSY Right 08/08/2023   Procedure: LYMPH NODE BIOPSY;  Surgeon: Kerrin Elspeth BROCKS, MD;  Location: [facility] OR;  Service: Thoracic;  Laterality: Right;   TONSILLECTOMY  1962   UPPER GI ENDOSCOPY  2015    Family History  Problem Relation Age of Onset   CAD Mother        MI at age 83   Heart attack Mother    Lung cancer Father    Throat cancer Sister    Prostate cancer Brother    Ovarian cancer Sister     Social History Social History   Tobacco Use   Smoking status: Every Day    Current packs/day: 1.00    Average packs/day: 1  pack/day for 57.0 years (57.0 ttl pk-yrs)    Types: Cigarettes    Start date: 1968   Smokeless tobacco: Never   Tobacco comments:    07-27-2023  Trying to quit since 09/ 2024  stated age 32,  previously 1.5 ppd,  pt stated down to as of today 3 cig per day  Vaping Use   Vaping status: Former   Devices: 07-27-2023  per pt tried for 3 wks approx  2020 e-cig , none since  Substance Use Topics   Alcohol use: Yes    Alcohol/week: 10.0 - 14.0 standard drinks of alcohol    Types: 10 - 14 Shots of liquor per week    Comment: 07-27-2023 per pt 2 per day, bourbon/ coke,  average 10-14   Drug use: Never    Allergies  Allergen Reactions   Finasteride Other (  See Comments)    dizziness    Current Outpatient Medications  Medication Sig Dispense Refill   albuterol  (VENTOLIN  HFA) 108 (90 Base) MCG/ACT inhaler Inhale 1-2 puffs into the lungs every 6 (six) hours as needed for wheezing or shortness of breath.     aspirin  EC 81 MG tablet Take 81 mg by mouth daily.     b complex vitamins capsule Take 1 capsule by mouth daily.     Budeson-Glycopyrrol-Formoterol  (BREZTRI  AEROSPHERE) 160-9-4.8 MCG/ACT AERO Inhale 2 puffs into the lungs in the morning and at bedtime. 5.9 g 11   Cholecalciferol (VITAMIN D3) 50 MCG (2000 UT) capsule Take 2,000 Units by mouth daily.     docusate sodium  (COLACE) 100 MG capsule Take 1 capsule (100 mg total) by mouth daily as needed for up to 30 doses. 30 capsule 0   gabapentin  (NEURONTIN ) 300 MG capsule Take 1 capsule (300 mg total) by mouth at bedtime. 30 capsule 0   guaiFENesin  (MUCINEX ) 600 MG 12 hr tablet Take 1 tablet (600 mg total) by mouth 2 (two) times daily as needed.     levothyroxine  (SYNTHROID ) 88 MCG tablet Take 88 mcg by mouth daily before breakfast.  0   Magnesium 250 MG CAPS Take 250 mg by mouth daily.     metFORMIN  (GLUCOPHAGE ) 500 MG tablet Take 500 mg by mouth 2 (two) times daily with a meal.  5   naproxen sodium (ALEVE) 220 MG tablet Take 220 mg by mouth.      oxyCODONE -acetaminophen  (PERCOCET) 5-325 MG tablet Take 1 tablet by mouth every 4 (four) hours as needed for up to 18 doses for severe pain (pain score 7-10). 18 tablet 0   rosuvastatin  (CRESTOR ) 5 MG tablet Take 5 mg by mouth daily.     tamsulosin  (FLOMAX ) 0.4 MG CAPS capsule Take 0.4 mg by mouth daily.     No current facility-administered medications for this visit.    REVIEW OF SYSTEMS:   Review of Systems  Constitutional: Negative for appetite change, chills, fatigue, fever and unexpected weight change.  HENT: Positive for intermittent hoarseness.  Negative for mouth sores, nosebleeds, sore throat and trouble swallowing.   Eyes: Negative for eye problems and icterus.  Respiratory: Positive for dyspnea on exertion and mild intermittent cough. Negative for hemoptysis and wheezing.   Cardiovascular: Negative for chest pain and leg swelling.  Gastrointestinal: Positive for constipation. Negative for abdominal pain, constipation, diarrhea, nausea and vomiting.  Genitourinary: Negative for bladder incontinence, difficulty urinating, dysuria, frequency and hematuria.   Musculoskeletal: Positive for some pain in the back/leg. Negative for back pain, gait problem, neck pain and neck stiffness.  Skin: Negative for itching and rash.  Neurological: Negative for dizziness, extremity weakness, gait problem, headaches, light-headedness and seizures.  Hematological: Negative for adenopathy. Does not bruise/bleed easily.  Psychiatric/Behavioral: Negative for confusion, depression and sleep disturbance. The patient is not nervous/anxious.     PHYSICAL EXAMINATION:  Blood pressure 129/71, pulse 63, temperature 98.2 F (36.8 C), temperature source Temporal, resp. rate 15, height 5' 7 (1.702 m), weight 196 lb (88.9 kg), SpO2 98%.  ECOG PERFORMANCE STATUS: 1  Physical Exam  Constitutional: Oriented to person, place, and time and well-developed, well-nourished, and in no distress.  HENT:  Head:  Normocephalic and atraumatic.  Mouth/Throat: Oropharynx is clear and moist. No oropharyngeal exudate.  Eyes: Conjunctivae are normal. Right eye exhibits no discharge. Left eye exhibits no discharge. No scleral icterus.  Neck: Normal range of motion. Neck supple.  Cardiovascular: Normal  rate, regular rhythm, normal heart sounds and intact distal pulses.   Pulmonary/Chest: Effort normal and breath sounds normal. No respiratory distress. No wheezes. No rales.  Abdominal: Soft. Bowel sounds are normal. Exhibits no distension and no mass. There is no tenderness.  Musculoskeletal: Normal range of motion. Exhibits no edema.  Lymphadenopathy:    No cervical adenopathy.  Neurological: Alert and oriented to person, place, and time. Exhibits muscle wasting. Uses a cane for ambulation. Gait normal. Coordination normal.  Skin: Skin is warm and dry. No rash noted. Not diaphoretic. No erythema. No pallor.  Psychiatric: Mood, memory and judgment normal.  Vitals reviewed.  LABORATORY DATA: Lab Results  Component Value Date   WBC 8.9 10/03/2023   HGB 15.6 10/03/2023   HCT 45.9 10/03/2023   MCV 96.2 10/03/2023   PLT 227 10/03/2023      Chemistry      Component Value Date/Time   NA 137 10/03/2023 1335   K 4.2 10/03/2023 1335   CL 103 10/03/2023 1335   CO2 29 10/03/2023 1335   BUN 12 10/03/2023 1335   CREATININE 1.00 10/03/2023 1335      Component Value Date/Time   CALCIUM  10.2 10/03/2023 1335   ALKPHOS 55 10/03/2023 1335   AST 14 (L) 10/03/2023 1335   ALT 14 10/03/2023 1335   BILITOT 0.5 10/03/2023 1335       RADIOGRAPHIC STUDIES: No results found.  ASSESSMENT: This is a very pleasant 73 year old Caucasian male diagnosed with stage I (T1b, N0, M0) invasive moderately differentiated adenocarcinoma, acinar predominant.  He was diagnosed in November 2024.  Patient has a left lower lobe nodule that was negative on biopsy that is being monitored by Dr. Shelah.  The patient was seen with Dr.  Sherrod today.  Dr. Sherrod explained that there is no adjuvant treatment required for stage I.  Standard of care is to continue on observation.  Dr. Sherrod would recommend a restaging CT scan in 6 months; however, since the patient is scheduled for a CT super D of the chest by Dr. Shelah on/8/25, we will stagger our CT scan to be performed in approximately 8 months or so from now.  We will see him back 1 week after the CT scan to review the results.  The patient voices understanding of current disease status and treatment options and is in agreement with the current care plan.  The patient was strongly encouraged to quit smoking.   All questions were answered. The patient knows to call the clinic with any problems, questions or concerns. We can certainly see the patient much sooner if necessary.  Thank you so much for allowing me to participate in the care of Leonard Walton. I will continue to follow up the patient with you and assist in his care.   Disclaimer: This note was dictated with voice recognition software. Similar sounding words can inadvertently be transcribed and may not be corrected upon review.   Jania Steinke L Telissa Palmisano October 03, 2023, 2:32 PM  ADDENDUM: Hematology/Oncology Attending:  I had a face-to-face encounter with the patient today.  I reviewed her records, lab, scans and recommended his care plan. This is a very pleasant 73 years old white male with past medical history significant for tobacco and alcohol abuse as well as COPD, dyslipidemia, hypothyroidism, diabetes mellitus, diabetic neuropathy, chronic kidney disease, depression and peptic ulcer disease.  The patient was enrolled on the CT scan lung cancer screening program and he was found in July 2024 to have  a suspicious spiculated 1.03 right lower lobe nodule.  He was seen by Dr. Shelah and underwent navigational bronchoscopy on May 13, 2023 and the final pathology was consistent with non-small cell lung  cancer favoring adenocarcinoma.  His PET scan and other imaging studies showed other tiny pulmonary nodules that need close monitoring.  The patient was referred to Dr. Kerrin and on August 08, 2023 he underwent right lower lobectomy with lymph node dissection and the final pathology was consistent with 1.1 cm moderately differentiated adenocarcinoma, acinar predominant.  The patient was referred to me today for evaluation and recommendation regarding his condition.  He is scheduled for repeat CT scan of the chest in 3 months by Dr. Shelah to monitor the other small pulmonary nodules. When seen today the patient is feeling fine with no concerning complaints except for mild fatigue and shortness of breath with exertion.  He was accompanied by his ex-wife. I had a lengthy discussion with the patient today about his condition.  I explained to him that he had received a curative treatment for his lung cancer with the surgical resection.  I also explained to the patient that there is no survival benefit for adjuvant systemic chemotherapy, radiation or targeted therapy for patient with a stage Ia non-small cell lung cancer and the current standard of care is close monitoring and observation. We will arrange for the patient to come back for follow-up visit in around 8 months with repeat CT scan of the chest for restaging of his disease.  He is already scheduled for another scan in April 2024 by Dr. Shelah and our next scan will be 5-6 months after the upcoming scan. The patient was advised to call immediately if he has any other concerning symptoms in the interval. The total time spent in the appointment was 60 minutes. Disclaimer: This note was dictated with voice recognition software. Similar sounding words can inadvertently be transcribed and may be missed upon review. Sherrod MARLA Sherrod, MD

## 2023-10-02 ENCOUNTER — Other Ambulatory Visit: Payer: Self-pay | Admitting: Physician Assistant

## 2023-10-02 DIAGNOSIS — C3491 Malignant neoplasm of unspecified part of right bronchus or lung: Secondary | ICD-10-CM

## 2023-10-03 ENCOUNTER — Inpatient Hospital Stay: Payer: Medicare Other | Attending: Physician Assistant | Admitting: Physician Assistant

## 2023-10-03 ENCOUNTER — Inpatient Hospital Stay: Payer: Medicare Other

## 2023-10-03 VITALS — BP 129/71 | HR 63 | Temp 98.2°F | Resp 15 | Ht 67.0 in | Wt 196.0 lb

## 2023-10-03 DIAGNOSIS — K5900 Constipation, unspecified: Secondary | ICD-10-CM | POA: Insufficient documentation

## 2023-10-03 DIAGNOSIS — F1010 Alcohol abuse, uncomplicated: Secondary | ICD-10-CM | POA: Diagnosis not present

## 2023-10-03 DIAGNOSIS — E1122 Type 2 diabetes mellitus with diabetic chronic kidney disease: Secondary | ICD-10-CM | POA: Insufficient documentation

## 2023-10-03 DIAGNOSIS — Z8052 Family history of malignant neoplasm of bladder: Secondary | ICD-10-CM | POA: Insufficient documentation

## 2023-10-03 DIAGNOSIS — Z801 Family history of malignant neoplasm of trachea, bronchus and lung: Secondary | ICD-10-CM | POA: Insufficient documentation

## 2023-10-03 DIAGNOSIS — J449 Chronic obstructive pulmonary disease, unspecified: Secondary | ICD-10-CM | POA: Diagnosis not present

## 2023-10-03 DIAGNOSIS — R918 Other nonspecific abnormal finding of lung field: Secondary | ICD-10-CM | POA: Insufficient documentation

## 2023-10-03 DIAGNOSIS — Z79899 Other long term (current) drug therapy: Secondary | ICD-10-CM | POA: Insufficient documentation

## 2023-10-03 DIAGNOSIS — Z902 Acquired absence of lung [part of]: Secondary | ICD-10-CM | POA: Insufficient documentation

## 2023-10-03 DIAGNOSIS — Z8041 Family history of malignant neoplasm of ovary: Secondary | ICD-10-CM | POA: Insufficient documentation

## 2023-10-03 DIAGNOSIS — N189 Chronic kidney disease, unspecified: Secondary | ICD-10-CM | POA: Diagnosis not present

## 2023-10-03 DIAGNOSIS — C3491 Malignant neoplasm of unspecified part of right bronchus or lung: Secondary | ICD-10-CM

## 2023-10-03 DIAGNOSIS — E1140 Type 2 diabetes mellitus with diabetic neuropathy, unspecified: Secondary | ICD-10-CM | POA: Insufficient documentation

## 2023-10-03 DIAGNOSIS — Z85118 Personal history of other malignant neoplasm of bronchus and lung: Secondary | ICD-10-CM | POA: Diagnosis not present

## 2023-10-03 DIAGNOSIS — F17210 Nicotine dependence, cigarettes, uncomplicated: Secondary | ICD-10-CM | POA: Insufficient documentation

## 2023-10-03 DIAGNOSIS — Z808 Family history of malignant neoplasm of other organs or systems: Secondary | ICD-10-CM

## 2023-10-03 LAB — CBC WITH DIFFERENTIAL (CANCER CENTER ONLY)
Abs Immature Granulocytes: 0.03 10*3/uL (ref 0.00–0.07)
Basophils Absolute: 0.1 10*3/uL (ref 0.0–0.1)
Basophils Relative: 1 %
Eosinophils Absolute: 0.2 10*3/uL (ref 0.0–0.5)
Eosinophils Relative: 2 %
HCT: 45.9 % (ref 39.0–52.0)
Hemoglobin: 15.6 g/dL (ref 13.0–17.0)
Immature Granulocytes: 0 %
Lymphocytes Relative: 36 %
Lymphs Abs: 3.2 10*3/uL (ref 0.7–4.0)
MCH: 32.7 pg (ref 26.0–34.0)
MCHC: 34 g/dL (ref 30.0–36.0)
MCV: 96.2 fL (ref 80.0–100.0)
Monocytes Absolute: 0.4 10*3/uL (ref 0.1–1.0)
Monocytes Relative: 5 %
Neutro Abs: 5 10*3/uL (ref 1.7–7.7)
Neutrophils Relative %: 56 %
Platelet Count: 227 10*3/uL (ref 150–400)
RBC: 4.77 MIL/uL (ref 4.22–5.81)
RDW: 12.5 % (ref 11.5–15.5)
WBC Count: 8.9 10*3/uL (ref 4.0–10.5)
nRBC: 0 % (ref 0.0–0.2)

## 2023-10-03 LAB — CMP (CANCER CENTER ONLY)
ALT: 14 U/L (ref 0–44)
AST: 14 U/L — ABNORMAL LOW (ref 15–41)
Albumin: 4.4 g/dL (ref 3.5–5.0)
Alkaline Phosphatase: 55 U/L (ref 38–126)
Anion gap: 5 (ref 5–15)
BUN: 12 mg/dL (ref 8–23)
CO2: 29 mmol/L (ref 22–32)
Calcium: 10.2 mg/dL (ref 8.9–10.3)
Chloride: 103 mmol/L (ref 98–111)
Creatinine: 1 mg/dL (ref 0.61–1.24)
GFR, Estimated: >60 mL/min (ref >60)
Glucose, Bld: 178 mg/dL — ABNORMAL HIGH (ref 70–99)
Potassium: 4.2 mmol/L (ref 3.5–5.1)
Sodium: 137 mmol/L (ref 135–145)
Total Bilirubin: 0.5 mg/dL (ref 0.0–1.2)
Total Protein: 7.2 g/dL (ref 6.5–8.1)

## 2023-10-04 ENCOUNTER — Ambulatory Visit
Admission: RE | Admit: 2023-10-04 | Discharge: 2023-10-04 | Disposition: A | Discharge status: Home / Self Care | Payer: Medicare Other | Source: Ambulatory Visit | Attending: Thoracic Surgery (Cardiothoracic Vascular Surgery) | Admitting: Thoracic Surgery (Cardiothoracic Vascular Surgery)

## 2023-10-04 ENCOUNTER — Encounter: Payer: Self-pay | Admitting: Thoracic Surgery (Cardiothoracic Vascular Surgery)

## 2023-10-04 ENCOUNTER — Ambulatory Visit (INDEPENDENT_AMBULATORY_CARE_PROVIDER_SITE_OTHER): Payer: Self-pay | Admitting: Thoracic Surgery (Cardiothoracic Vascular Surgery)

## 2023-10-04 VITALS — BP 136/79 | HR 79 | Resp 18 | Ht 67.0 in | Wt 198.0 lb

## 2023-10-04 DIAGNOSIS — C3431 Malignant neoplasm of lower lobe, right bronchus or lung: Secondary | ICD-10-CM

## 2023-10-04 DIAGNOSIS — R0602 Shortness of breath: Secondary | ICD-10-CM | POA: Diagnosis not present

## 2023-10-04 DIAGNOSIS — Z902 Acquired absence of lung [part of]: Secondary | ICD-10-CM

## 2023-10-04 NOTE — Progress Notes (Signed)
 [address]       Leonard Walton [address]             [phone]       HPI: Leonard Walton returns for follow-up after right lower lobectomy.  Leonard Walton is a 73 year old man with history of tobacco abuse, COPD, hyperlipidemia, hypothyroidism, type 2 diabetes, diabetic neuropathy, arthritis, depression, peptic ulcer disease, and stage Ia adenocarcinoma of the lung.  He was found to have a right lower lobe lung nodule low-dose CT for lung cancer screening.  Also had a smaller left lower lobe nodule.  Underwent robotic bronchoscopy.  The right lower lobe nodule was a non-small cell carcinoma.  He underwent robotic assisted right lower lobectomy on 08/08/2023.  Pathology showed a T1b, N0, stage Ia adenocarcinoma.  Did well postoperatively and went home on day 3.  I saw him in the office on 08/25/2023.  He had some incisional related pain but his primary complaint was pain and numbness in the lateral aspect of the left thigh.  Feels well.  Does get short of breath when he walks upstairs.  Numbness along right costal margin but no significant pain.  Still has pain and numbness in his left lateral thigh.  Is walking with a cane and using a knee brace.  Also gabapentin  was changed to pregabalin.  Has gotten a little better.  Past Medical History:  Diagnosis Date   Alcohol abuse    hx acute alcoholism,  per pcp note in care everywhere pt has cut down but not quit   B12 deficiency    Benign localized prostatic hyperplasia with lower urinary tract symptoms (LUTS)    urologist--- dr gay   Bladder calculus    Cigarette nicotine dependence    COPD (chronic obstructive pulmonary disease) (HCC)    pulmonolgy --- dr byrum   History of Helicobacter pylori infection 2015   treated   Hyperlipidemia, mixed    Hypothyroidism    followed pcp   Lesion of urinary bladder    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   OA (osteoarthritis)     knees/ hands/ shoulders/ elbows/  hips   Type 2 diabetes mellitus (HCC)    followed by pcp    (07-27-2023  pt stated checks blood sugar daily am fasting,  average blood sugar 100--120s)   Vitamin D deficiency    Wears dentures    upper full   Wears glasses     Current Outpatient Medications  Medication Sig Dispense Refill   albuterol  (VENTOLIN  HFA) 108 (90 Base) MCG/ACT inhaler Inhale 1-2 puffs into the lungs every 6 (six) hours as needed for wheezing or shortness of breath.     aspirin  EC 81 MG tablet Take 81 mg by mouth daily.     b complex vitamins capsule Take 1 capsule by mouth daily.     Budeson-Glycopyrrol-Formoterol  (BREZTRI  AEROSPHERE) 160-9-4.8 MCG/ACT AERO Inhale 2 puffs into the lungs in the morning and at bedtime. 5.9 g 11   Cholecalciferol (VITAMIN D3) 50 MCG (2000 UT) capsule Take 2,000 Units by mouth daily.     docusate sodium  (COLACE) 100 MG capsule Take 1 capsule (100 mg total) by mouth daily as needed for up to 30 doses. 30 capsule 0   guaiFENesin  (MUCINEX ) 600 MG 12 hr tablet Take 1 tablet (600 mg total) by mouth 2 (two) times daily as needed.     levothyroxine  (SYNTHROID ) 88  MCG tablet Take 88 mcg by mouth daily before breakfast.  0   Magnesium 250 MG CAPS Take 250 mg by mouth daily.     metFORMIN  (GLUCOPHAGE ) 500 MG tablet Take 500 mg by mouth 2 (two) times daily with a meal.  5   naproxen sodium (ALEVE) 220 MG tablet Take 220 mg by mouth.     oxyCODONE -acetaminophen  (PERCOCET) 5-325 MG tablet Take 1 tablet by mouth every 4 (four) hours as needed for up to 18 doses for severe pain (pain score 7-10). 18 tablet 0   pregabalin (LYRICA) 50 MG capsule 1 capsule Orally Once a day for 30 days     rosuvastatin  (CRESTOR ) 5 MG tablet Take 5 mg by mouth daily.     tamsulosin  (FLOMAX ) 0.4 MG CAPS capsule Take 0.4 mg by mouth daily.     No current facility-administered medications for this visit.    Physical Exam BP 136/79   Pulse 79   Resp 18   Ht 5' 7 (1.702 m)   Wt 198  lb (89.8 kg)   SpO2 98% Comment: RA  BMI 31.44 kg/m  73 year old man in no acute distress, ambulates with cane and a limp Alert and oriented x 3 with no focal deficits Lungs diminished at right base but otherwise clear Cardiac regular rate and rhythm Incisions well-healed  Diagnostic Tests: CHEST - 2 VIEW   COMPARISON:  08/25/2023.   FINDINGS: Redemonstration of small-to-moderate probably loculated right pleural effusion with associated compressive atelectatic changes at the right lung base, essentially similar to the prior study. Bilateral lung fields are otherwise clear. Left costophrenic angle is clear.   Stable cardio-mediastinal silhouette.   No acute osseous abnormalities.   The soft tissues are within normal limits.   IMPRESSION: *Persistent right basilar opacity, as described above.     Electronically Signed   By: Ree Molt M.D.   On: 10/04/2023 11:25 Personally reviewed the chest x-ray images.  No change from his previous film.  Loculated fluid at right base more impressive PA than lateral films.  Impression: Leonard Walton is a 73 year old man with history of tobacco abuse, COPD, hyperlipidemia, hypothyroidism, type 2 diabetes, diabetic neuropathy, arthritis, depression, peptic ulcer disease, and stage Ia adenocarcinoma of the lung.  Stage Ia adenocarcinoma right lower lobe-status post right lower lobectomy.  He saw Dr. Sherrod.  No indication for adjuvant therapy.  Status post right lower lobectomy-doing well from a pain standpoint.  Exercise tolerance about as expected.  Does have some loculated fluid but there is enough to warrant thoracentesis.  Left lower lobe lung nodule-to have a repeat CT in April with Dr. Shelah.  Left thigh pain and numbness-somewhat improved with Lyrica and knee brace.  Plan: Follow-up as scheduled with Dr. Shelah and Dr. Gatha Since he is seeing both Dr. Shelah and Dr. Sherrod he does not need to come back to see me as it  would be duplicative.  I am happy to see him back anytime if I can be of any further assistance with his care.  Elspeth JAYSON Millers, MD [facility] [phone]

## 2023-11-23 ENCOUNTER — Ambulatory Visit: Admitting: Emergency Medicine

## 2023-11-23 ENCOUNTER — Encounter: Payer: Self-pay | Admitting: Emergency Medicine

## 2023-11-23 VITALS — BP 132/76 | HR 78 | Ht 67.0 in | Wt 200.2 lb

## 2023-11-23 DIAGNOSIS — J449 Chronic obstructive pulmonary disease, unspecified: Secondary | ICD-10-CM | POA: Diagnosis not present

## 2023-11-23 DIAGNOSIS — R1084 Generalized abdominal pain: Secondary | ICD-10-CM

## 2023-11-23 DIAGNOSIS — R1011 Right upper quadrant pain: Secondary | ICD-10-CM | POA: Diagnosis not present

## 2023-11-23 DIAGNOSIS — R198 Other specified symptoms and signs involving the digestive system and abdomen: Secondary | ICD-10-CM

## 2023-11-23 DIAGNOSIS — C3491 Malignant neoplasm of unspecified part of right bronchus or lung: Secondary | ICD-10-CM

## 2023-11-23 NOTE — Assessment & Plan Note (Signed)
 No real tenderness although he does have some point tenderness in the costal margin in the area of his surgical resection.  The right upper quadrant is full, protruding somewhat.  Etiology unclear.  I will perform a CT scan of his abdomen to further evaluate, try to synchronize this with his CT scan of the chest which is ordered to follow his left-sided pulmonary nodule that was negative on bronchoscopy

## 2023-11-23 NOTE — Assessment & Plan Note (Signed)
 His right sided nodule was surgically resected.  We are still following a small left-sided nodule.  We will move his CT scan of the chest up to coincide with his scan of the abdomen and pelvis.

## 2023-11-23 NOTE — Patient Instructions (Addendum)
 We will arrange for a CT scan of the abdomen to evaluate the fullness on the right side We will try to move up your CT scan of the chest so that we can coordinate the 2 scans Continue Breztri 2 puffs twice a day.  Rinse and gargle after using. Following in our office in about 3 to 4 weeks after your CT scans so we can review those results together.

## 2023-11-23 NOTE — Progress Notes (Signed)
 Subjective:    Patient ID: Leonard Walton, male    DOB: 1951/01/03, 73 y.o.   MRN: [id number]  HPI  ROV 09/02/2023 --follow-up visit 73 year old gentleman with a history of tobacco use and COPD, right lower lobe adenocarcinoma diagnosed by navigational bronchoscopy and treated with surgical resection by Dr. Dorris Fetch 07/2023.  He also had a left lower lobe nodule that was negative on bronchoscopy that will need to be followed.  He has been started on [place] and reports that he is breathing well. His voice has been weaker since sgy. His exertional tolerance is a bit lower - can get sob with shopping. No albuterol use. No real cough except at night when he lays down. No overt GERD sx.   ROV 11/23/2023 --73 year old active smoker with COPD and right lower lobe adenocarcinoma that was surgically resected 07/2023.  He also has a left lower lobe nodule that was negative by bronchoscopy but needs to be followed with interval films.  I started him on Breztri for COPD.  He has mild obstruction on pulmonary function testing 05/30/2023.  He has been seen by oncology and he is on observation. Today he reports exertional dyspnea and some wheezing that he notices principally at night.  He has some choking while swallowing and eating. He started to notice some R upper abd fullness more prominent beginning about 2 weeks ago. He has some pain at his R ribs near his incision site.    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   OA (osteoarthritis)    knees/ hands/ shoulders/ elbows/  hips   Type 2 diabetes mellitus (HCC)    followed by pcp    (07-27-2023  pt stated checks blood sugar daily am fasting,  average blood sugar 100--120s)   Vitamin D deficiency    Wears dentures    upper full   Wears glasses      Family History  Problem Relation Age of Onset   CAD Mother        MI at age 28   Heart attack Mother    Lung cancer Father    Throat cancer Sister    Prostate cancer Brother    Ovarian cancer Sister      Social History   Socioeconomic History   Marital status: Divorced    Spouse name: Not on file   Number of children: 3   Years of education: college   Highest education level: Associate degree: academic program  Occupational History   Occupation: retired from [employer]  Tobacco Use   Smoking status: Former    Current packs/day: 1.00    Average packs/day: 1 pack/day for 57.2 years (57.2 ttl pk-yrs)    Types: Cigarettes    Start date: 1968  Smokeless tobacco: Never   Tobacco comments:    Currently quit/ trying to quit  Vaping Use   Vaping status: Former   Devices: 07-27-2023  per pt tried for 3 wks approx  2020 e-cig , none since  Substance and Sexual Activity   Alcohol use: Yes    Alcohol/week: 10.0 - 14.0 standard drinks of alcohol    Types: 10 - 14 Shots of liquor per week    Comment: 07-27-2023 per pt 2 per day, bourbon/ coke,  average 10-14   Drug use: Never   Sexual activity: Yes  Other Topics Concern   Not on file  Social History Narrative   Lives alone.   Right-handed.   Occasional soda.   Social Drivers of Corporate investment banker Strain: Not on file  Food Insecurity: No Food Insecurity (08/08/2023)   Hunger Vital Sign    Worried About Running Out of Food in the Last Year: Never true    Ran Out of Food in the Last Year: Never true   Transportation Needs: No Transportation Needs (08/08/2023)   PRAPARE - Administrator, Civil Service (Medical): No    Lack of Transportation (Non-Medical): No  Physical Activity: Not on file  Stress: Not on file  Social Connections: Not on file  Intimate Partner Violence: Not At Risk (08/08/2023)   Humiliation, Afraid, Rape, and Kick questionnaire    Fear of Current or Ex-Partner: No    Emotionally Abused: No    Physically Abused: No    Sexually Abused: No     Allergies  Allergen Reactions   Finasteride Other (See Comments)    dizziness     Outpatient Medications Prior to Visit  Medication Sig Dispense Refill   albuterol (VENTOLIN HFA) 108 (90 Base) MCG/ACT inhaler Inhale 1-2 puffs into the lungs every 6 (six) hours as needed for wheezing or shortness of breath.     aspirin EC 81 MG tablet Take 81 mg by mouth daily.     b complex vitamins capsule Take 1 capsule by mouth daily.     Budeson-Glycopyrrol-Formoterol (BREZTRI AEROSPHERE) 160-9-4.8 MCG/ACT AERO Inhale 2 puffs into the lungs in the morning and at bedtime. 5.9 g 11   Cholecalciferol (VITAMIN D3) 50 MCG (2000 UT) capsule Take 2,000 Units by mouth daily.     levothyroxine (SYNTHROID) 88 MCG tablet Take 88 mcg by mouth daily before breakfast.  0   Magnesium 250 MG CAPS Take 250 mg by mouth daily.     metFORMIN (GLUCOPHAGE) 500 MG tablet Take 500 mg by mouth 2 (two) times daily with a meal.  5   naproxen sodium (ALEVE) 220 MG tablet Take 220 mg by mouth.     rosuvastatin (CRESTOR) 5 MG tablet Take 5 mg by mouth daily.     tamsulosin (FLOMAX) 0.4 MG CAPS capsule Take 0.4 mg by mouth daily.     docusate sodium (COLACE) 100 MG capsule Take 1 capsule (100 mg total) by mouth daily as needed for up to 30 doses. (Patient not taking: Reported on 11/23/2023) 30 capsule 0   guaiFENesin (MUCINEX) 600 MG 12 hr tablet Take 1 tablet (600 mg total) by mouth 2 (two) times daily as needed. (Patient not taking: Reported on 11/23/2023)      oxyCODONE-acetaminophen (PERCOCET) 5-325 MG tablet Take 1 tablet by mouth every 4 (four) hours as needed for up to 18 doses for severe pain (pain score 7-10). (Patient not taking: Reported on 11/23/2023) 18 tablet 0  pregabalin (LYRICA) 50 MG capsule 1 capsule Orally Once a day for 30 days (Patient not taking: Reported on 11/23/2023)     No facility-administered medications prior to visit.        Objective:   Physical Exam Vitals:   11/23/23 1536  BP: 132/76  Pulse: 78  SpO2: 96%  Weight: 200 lb 3.2 oz (90.8 kg)  Height: 5\' 7"  (1.702 m)   Gen: Pleasant, well-nourished, in no distress,  normal affect  ENT: No lesions,  mouth clear,  oropharynx clear, no postnasal drip  Neck: No JVD, no stridor  Lungs: No use of accessory muscles, no crackles or wheezing on normal respiration, no wheeze on forced expiration  Cardiovascular: RRR, heart sounds normal, no murmur or gallops, no peripheral edema  Abdomen: His right upper quadrant is somewhat protruding, full but soft and nontender.  His right costal margin is tender to palpation.  Musculoskeletal: No deformities, no cyanosis or clubbing  Neuro: alert, awake, non focal  Skin: Warm, no lesions or rash      Assessment & Plan:  Abdominal fullness in right upper quadrant No real tenderness although he does have some point tenderness in the costal margin in the area of his surgical resection.  The right upper quadrant is full, protruding somewhat.  Etiology unclear.  I will perform a CT scan of his abdomen to further evaluate, try to synchronize this with his CT scan of the chest which is ordered to follow his left-sided pulmonary nodule that was negative on bronchoscopy  Adenocarcinoma of lung, stage 1, right (HCC) His right sided nodule was surgically resected.  We are still following a small left-sided nodule.  We will move his CT scan of the chest up to coincide with his scan of the abdomen and pelvis.  COPD (chronic obstructive  pulmonary disease) (HCC) Continue Breztri as ordered     Levy Pupa, MD, PhD 11/23/2023, 3:55 PM [facility] [phone] or if no answer before 7:00PM call [phone] For any issues after 7:00PM please call eLink [phone]

## 2023-11-23 NOTE — Assessment & Plan Note (Signed)
 Continue Breztri as ordered

## 2023-11-24 DIAGNOSIS — E119 Type 2 diabetes mellitus without complications: Secondary | ICD-10-CM | POA: Diagnosis not present

## 2023-12-20 ENCOUNTER — Ambulatory Visit: Admitting: Acute Care

## 2023-12-21 ENCOUNTER — Ambulatory Visit
Admission: RE | Admit: 2023-12-21 | Discharge: 2023-12-21 | Disposition: A | Discharge status: Home / Self Care | Source: Ambulatory Visit | Attending: Emergency Medicine | Admitting: Emergency Medicine

## 2023-12-21 DIAGNOSIS — R911 Solitary pulmonary nodule: Secondary | ICD-10-CM

## 2023-12-21 DIAGNOSIS — Z85118 Personal history of other malignant neoplasm of bronchus and lung: Secondary | ICD-10-CM | POA: Diagnosis not present

## 2023-12-21 DIAGNOSIS — J90 Pleural effusion, not elsewhere classified: Secondary | ICD-10-CM | POA: Diagnosis not present

## 2023-12-21 DIAGNOSIS — R9389 Abnormal findings on diagnostic imaging of other specified body structures: Secondary | ICD-10-CM | POA: Diagnosis not present

## 2023-12-21 DIAGNOSIS — R599 Enlarged lymph nodes, unspecified: Secondary | ICD-10-CM | POA: Diagnosis not present

## 2023-12-21 DIAGNOSIS — R1084 Generalized abdominal pain: Secondary | ICD-10-CM

## 2023-12-21 DIAGNOSIS — K639 Disease of intestine, unspecified: Secondary | ICD-10-CM | POA: Diagnosis not present

## 2023-12-21 DIAGNOSIS — R1011 Right upper quadrant pain: Secondary | ICD-10-CM

## 2023-12-21 DIAGNOSIS — Z902 Acquired absence of lung [part of]: Secondary | ICD-10-CM | POA: Diagnosis not present

## 2023-12-21 MED ORDER — IOPAMIDOL (ISOVUE-300) INJECTION 61%: 100.0000 mL | Freq: Once | INTRAVENOUS | Status: DC | PRN

## 2023-12-21 MED ORDER — IOPAMIDOL (ISOVUE-300) INJECTION 61%
100.0000 mL | Freq: Once | INTRAVENOUS | Status: AC | PRN
  Administered 2023-12-21: 100 mL via INTRAVENOUS

## 2023-12-26 ENCOUNTER — Telehealth: Payer: Self-pay | Admitting: *Deleted

## 2023-12-26 NOTE — Telephone Encounter (Signed)
 He has loculated right pleural effusion with volume loss.  Right lower lobe nodule now difficult to see.  I think he needs to see medical oncology ASAP.  May need a right thoracentesis of that would be the easiest initial test versus biopsy of his abdominal findings. Probably also needs a PET asap

## 2023-12-28 ENCOUNTER — Ambulatory Visit: Admitting: Physician Assistant

## 2023-12-28 NOTE — Telephone Encounter (Signed)
 Working on getting him into Oncology office asap

## 2023-12-28 NOTE — Progress Notes (Unsigned)
 [facility] OFFICE PROGRESS NOTE  Leonard Palmer, MD [address] [place] [address] [place] [address]  DIAGNOSIS: Suspicious metastatic malignancy. He was found to have omental caking/nodularity greatest on the right, concerning for carcinomatosis, sigmoid colonic wall thickening, Stable prominent external iliac lymph nodes, favored reactive but technically nonspecific, and non-specific Heterogeneous enhancement of an enlarged prostate gland. He has a history of stage I (T1b, N0, M0) invasive moderately differentiated adenocarcinoma, acinar predominant.  He was diagnosed in November 2024.  Patient has a left lower lobe nodule that was negative on biopsy that is being monitored by Dr. Delton Coombes.   PRIOR THERAPY: robotic assisted right lower lobectomy with lymph node dissection on 08/08/2023 under the care of Dr. Dorris Fetch  CURRENT THERAPY: Pending further workup  INTERVAL HISTORY: Leonard Walton 73 y.o. male returns to the clinic today for a follow up visit. In summary, the patient was diagnosed with stage I non-small cell lung cancer, adenocarcinoma. This was resected in November 2024 and the patient was on observation. He is following with Dr. Delton Coombes for a small LLL nodule (previously biopsy negative). Unfortunately, since last being seen, the patient had a routine follow up with pulmonary medicine and was endorsing some abdominal distention. Therefore, Dr. Delton Coombes added a CT of the AP to his upcoming CT chest super D. Unfortunately, the scan showed extensive metastatic cancer in the abdomen. Of note, the patient's last colonoscopy was ***. PSA*** Any other history of cancer ***  He reports baseline dyspnea exertion for which he takes Breztri twice a day and has an albuterol rescue inhaler, although he has not needed to take this in some time. His appetite is stable. Denies any hemoptysis. Denies any nausea, vomiting, or diarrhea. He sometimes has constipation intermittently for which  he takes a stool softener. He also takes magnesium. Blood in the stool. Abdominal pain ***. He is here for a more detailed discussion about his current conditioned further workup.   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  MEDICATIONS:  Current Outpatient Medications  Medication Sig Dispense Refill   albuterol (VENTOLIN HFA) 108 (90 Base) MCG/ACT inhaler Inhale 1-2 puffs into the lungs every 6 (six) hours as needed for wheezing or shortness of breath.     aspirin EC 81 MG tablet Take 81 mg by mouth daily.     b complex vitamins  capsule Take 1 capsule by mouth daily.     Budeson-Glycopyrrol-Formoterol (BREZTRI AEROSPHERE) 160-9-4.8 MCG/ACT AERO Inhale 2 puffs into the lungs in the morning and at bedtime. 5.9 g 11   Cholecalciferol (VITAMIN D3) 50 MCG (2000 UT) capsule Take 2,000 Units by mouth daily.     docusate sodium (COLACE) 100 MG capsule Take 1 capsule (100 mg total) by mouth daily as needed for up to 30 doses. (Patient not taking: Reported on 11/23/2023)  30 capsule 0   guaiFENesin (MUCINEX) 600 MG 12 hr tablet Take 1 tablet (600 mg total) by mouth 2 (two) times daily as needed. (Patient not taking: Reported on 11/23/2023)     levothyroxine (SYNTHROID) 88 MCG tablet Take 88 mcg by mouth daily before breakfast.  0   Magnesium 250 MG CAPS Take 250 mg by mouth daily.     metFORMIN (GLUCOPHAGE) 500 MG tablet Take 500 mg by mouth 2 (two) times daily with a meal.  5   naproxen sodium (ALEVE) 220 MG tablet Take 220 mg by mouth.     oxyCODONE-acetaminophen (PERCOCET) 5-325 MG tablet Take 1 tablet by mouth every 4 (four) hours as needed for up to 18 doses for severe pain (pain score 7-10). (Patient not taking: Reported on 11/23/2023) 18 tablet 0   pregabalin (LYRICA) 50 MG capsule 1 capsule Orally Once a day for 30 days (Patient not taking: Reported on 11/23/2023)     rosuvastatin (CRESTOR) 5 MG tablet Take 5 mg by mouth daily.     tamsulosin (FLOMAX) 0.4 MG CAPS capsule Take 0.4 mg by mouth daily.     No current facility-administered medications for this visit.    SURGICAL HISTORY:  Past Surgical History:  Procedure Laterality Date   BRONCHIAL BIOPSY  06/06/2023   Procedure: BRONCHIAL BIOPSIES;  Surgeon: Leslye Peer, MD;  Location: [facility] ENDOSCOPY;  Service: Pulmonary;;   BRONCHIAL BRUSHINGS  06/06/2023   Procedure: BRONCHIAL BRUSHINGS;  Surgeon: Leslye Peer, MD;  Location: [facility] ENDOSCOPY;  Service: Pulmonary;;   BRONCHIAL NEEDLE ASPIRATION BIOPSY  06/06/2023   Procedure: BRONCHIAL NEEDLE ASPIRATION BIOPSIES;  Surgeon: Leslye Peer, MD;  Location: [facility] ENDOSCOPY;  Service: Pulmonary;;   COLONOSCOPY  2023   CYSTOSCOPY WITH BIOPSY N/A 08/01/2023   Procedure: CYSTOSCOPY WITH BLADDER BIOPSY, URETHERAl DILATION AND FULGURATION OF PROSTATE;  Surgeon: Jannifer Hick, MD;  Location: [facility];  Service: Urology;  Laterality: N/A;  60 MINUTES NEEDED FOR CASE   CYSTOSCOPY WITH LITHOLAPAXY N/A 08/01/2023   Procedure: CYSTOLITHOLAPAXY;  Surgeon:  Jannifer Hick, MD;  Location: [facility];  Service: Urology;  Laterality: N/A;   FIDUCIAL MARKER PLACEMENT  06/06/2023   Procedure: FIDUCIAL MARKER PLACEMENT;  Surgeon: Leslye Peer, MD;  Location: [facility] ENDOSCOPY;  Service: Pulmonary;;   INTERCOSTAL NERVE BLOCK Right 08/08/2023   Procedure: INTERCOSTAL NERVE BLOCK;  Surgeon: Loreli Slot, MD;  Location: [facility] OR;  Service: Thoracic;  Laterality: Right;   KNEE SURGERY Right    1966  and 1970s   LUMBAR LAMINECTOMY  10/19/2007   @[facility]  by  Dr. Gerlene Fee;  L3--5   LYMPH NODE BIOPSY Right 08/08/2023   Procedure: LYMPH NODE BIOPSY;  Surgeon: Loreli Slot, MD;  Location: [facility] OR;  Service: Thoracic;  Laterality: Right;   TONSILLECTOMY  1962   UPPER GI ENDOSCOPY  2015    REVIEW OF SYSTEMS:   Review of Systems  Constitutional: Negative for appetite change, chills, fatigue, fever and unexpected weight change.  HENT:   Negative for mouth sores, nosebleeds, sore throat and trouble swallowing.   Eyes: Negative for eye problems and icterus.  Respiratory: Negative for cough, hemoptysis, shortness of breath and wheezing.   Cardiovascular: Negative for chest pain and leg swelling.  Gastrointestinal: Negative for abdominal pain, constipation, diarrhea, nausea and vomiting.  Genitourinary: Negative for bladder incontinence, difficulty urinating, dysuria, frequency and hematuria.   Musculoskeletal: Negative for back pain, gait problem, neck pain and neck stiffness.  Skin: Negative for itching and rash.  Neurological: Negative for dizziness, extremity weakness, gait problem, headaches, light-headedness and seizures.  Hematological: Negative for adenopathy. Does not bruise/bleed easily.  Psychiatric/Behavioral: Negative for confusion, depression and sleep disturbance. The patient is not nervous/anxious.     PHYSICAL EXAMINATION:  There were no vitals taken for this visit.  ECOG PERFORMANCE STATUS: {CHL ONC ECOG  [id number]  Physical Exam  Constitutional: Oriented to person, place, and time and well-developed, well-nourished, and in no distress. No distress.  HENT:  Head: Normocephalic and atraumatic.  Mouth/Throat: Oropharynx is clear and moist. No oropharyngeal exudate.  Eyes: Conjunctivae are normal. Right eye exhibits no discharge. Left eye exhibits no discharge. No scleral icterus.  Neck: Normal range of motion. Neck supple.  Cardiovascular: Normal rate, regular rhythm, normal heart sounds and intact distal pulses.   Pulmonary/Chest: Effort normal and breath sounds normal. No respiratory distress. No wheezes. No rales.  Abdominal: Soft. Bowel sounds are normal. Exhibits no distension and no mass. There is no tenderness.  Musculoskeletal: Normal range of motion. Exhibits no edema.  Lymphadenopathy:    No cervical adenopathy.  Neurological: Alert and oriented to person, place, and time. Exhibits normal muscle tone. Gait normal. Coordination normal.  Skin: Skin is warm and dry. No rash noted. Not diaphoretic. No erythema. No pallor.  Psychiatric: Mood, memory and judgment normal.  Vitals reviewed.  LABORATORY DATA: Lab Results  Component Value Date   WBC 8.9 10/03/2023   HGB 15.6 10/03/2023   HCT 45.9 10/03/2023   MCV 96.2 10/03/2023   PLT 227 10/03/2023      Chemistry      Component Value Date/Time   NA 137 10/03/2023 1335   K 4.2 10/03/2023 1335   CL 103 10/03/2023 1335   CO2 29 10/03/2023 1335   BUN 12 10/03/2023 1335   CREATININE 1.00 10/03/2023 1335      Component Value Date/Time   CALCIUM 10.2 10/03/2023 1335   ALKPHOS 55 10/03/2023 1335   AST 14 (L) 10/03/2023 1335   ALT 14 10/03/2023 1335   BILITOT 0.5 10/03/2023 1335       RADIOGRAPHIC STUDIES:  CT ABDOMEN PELVIS W CONTRAST Result Date: 12/25/2023 CLINICAL DATA:  Right-sided/right lower quadrant abdominal pain. 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 * Tracking Code: BO * EXAM: CT ABDOMEN  AND PELVIS WITH CONTRAST TECHNIQUE: Multidetector CT imaging of the abdomen and pelvis was performed using the standard protocol following bolus administration of intravenous contrast. RADIATION DOSE REDUCTION: This exam was performed according to the departmental dose-optimization program which includes automated exposure control, adjustment of the mA and/or kV according to patient size and/or use of iterative reconstruction technique. CONTRAST:  ISOVUE-300 IOPAMIDOL (ISOVUE-300) INJECTION 61% COMPARISON:  PET-CT June 30, 2023 and CT abdomen and pelvis foot November 04, 2022. FINDINGS: Lower chest: Please refer to same day CT of the chest for findings above the diaphragm. Hepatobiliary: No suspicious hepatic lesion. Gallbladder is unremarkable. No biliary ductal dilation. Pancreas: No pancreatic ductal dilation or  evidence of acute inflammation. Spleen: No splenomegaly or focal splenic lesion. Adrenals/Urinary Tract: No suspicious adrenal nodule/mass. Nonobstructive 2-3 mm left lower pole renal stone. Kidneys demonstrate symmetric enhancement. No hydronephrosis. Multiple bladder wall diverticula. Stomach/Bowel: Stomach is nondistended limiting evaluation. No pathologic dilation of small or large bowel. Colonic diverticulosis. Short segment sigmoid colonic wall thickening. Vascular/Lymphatic: Aortic atherosclerosis. Normal caliber abdominal aorta. Smooth IVC contours. The portal, splenic and superior mesenteric veins are patent. No pathologically enlarged abdominal or pelvic lymph nodes. Prominent hyperenhancing bilateral external iliac lymph nodes are stable from CT November 04, 2022 measuring 7 mm in short axis on the left and 5 mm in short axis on the right on image 74/2, favored reactive but nonspecific. Reproductive: Heterogeneous enhancement of an enlarged prostate gland Other: New trace pelvic free fluid with omental caking/nodularity greatest on the right on image 42/2. tiny fat containing umbilical  hernia. Musculoskeletal: No aggressive lytic or blastic lesion of bone. Multilevel degenerative changes of the spine. IMPRESSION: 1. New trace pelvic free fluid with omental caking/nodularity greatest on the right, concerning for carcinomatosis. 2. Short segment sigmoid colonic wall thickening, which may be secondary to underdistention or colitis suggest clinical correlation and consider further evaluation with colonoscopy if not recently performed to exclude underlying mass lesion. 3. Stable prominent external iliac lymph nodes, favored reactive but technically nonspecific. Suggest attention on follow-up imaging. 4. Nonobstructive 2-3 mm left lower pole renal stone. 5. Heterogeneous enhancement of an enlarged prostate gland, nonspecific. 6. Multiple bladder wall diverticula, which may reflect sequela of chronic bladder outlet obstruction. 7. Aortic atherosclerosis. These results will be called to the ordering clinician or representative by the Radiologist Assistant, and communication documented in the PACS or [employer]. Electronically Signed   By: Maudry Mayhew M.D.   On: 12/25/2023 11:14     ASSESSMENT/PLAN:  This is a very pleasant 73 year old Caucasian male Suspicious metastatic malignancy. He was found to have omental caking/nodularity greatest on the right, concerning for carcinomatosis, sigmoid colonic wall thickening, Stable prominent external iliac lymph nodes, favored reactive but technically nonspecific, and non-specific Heterogeneous enhancement of an enlarged prostate gland. He has a history of stage I (T1b, N0, M0) invasive moderately differentiated adenocarcinoma, acinar predominant.  He was diagnosed in November 2024.  Patient has a left lower lobe nodule that was negative on biopsy that is being monitored by Dr. Delton Coombes.   The patient was seen with Dr. Arbutus Ped today. Dr. Arbutus Ped discussed necessary further workup.   I will order a PET scan to restage his disease  Additionally, I  will order CT biopsy of the omentum.   We will see him back in 3 weeks for evaluation and to review the results and treatment options at that time.   ***brain MRI  Thora ***  Loculated right pleural effusion ***  ***send moleculars on prior resection of lung adeno???  The patient was advised to call immediately if she has any concerning symptoms in the interval. The patient voices understanding of current disease status and treatment options and is in agreement with the current care plan. All questions were answered. The patient knows to call the clinic with any problems, questions or concerns. We can certainly see the patient much sooner if necessary    No orders of the defined types were placed in this encounter.    I spent {CHL ONC TIME VISIT - NWGNF:[id number]} counseling the patient face to face. The total time spent in the appointment was {CHL ONC TIME VISIT - IONGE:[id number]}.  Leonard Vahey L Alexandra Posadas, PA-C 12/28/23

## 2023-12-29 ENCOUNTER — Other Ambulatory Visit: Payer: Self-pay | Admitting: Physician Assistant

## 2023-12-29 ENCOUNTER — Inpatient Hospital Stay

## 2023-12-29 ENCOUNTER — Inpatient Hospital Stay: Attending: Physician Assistant | Admitting: Physician Assistant

## 2023-12-29 VITALS — BP 133/69 | HR 67 | Temp 97.1°F | Resp 13 | Wt 197.1 lb

## 2023-12-29 DIAGNOSIS — Z85118 Personal history of other malignant neoplasm of bronchus and lung: Secondary | ICD-10-CM | POA: Insufficient documentation

## 2023-12-29 DIAGNOSIS — R590 Localized enlarged lymph nodes: Secondary | ICD-10-CM | POA: Insufficient documentation

## 2023-12-29 DIAGNOSIS — C3491 Malignant neoplasm of unspecified part of right bronchus or lung: Secondary | ICD-10-CM | POA: Diagnosis not present

## 2023-12-29 DIAGNOSIS — Z902 Acquired absence of lung [part of]: Secondary | ICD-10-CM | POA: Diagnosis not present

## 2023-12-29 DIAGNOSIS — N400 Enlarged prostate without lower urinary tract symptoms: Secondary | ICD-10-CM | POA: Insufficient documentation

## 2023-12-29 DIAGNOSIS — C786 Secondary malignant neoplasm of retroperitoneum and peritoneum: Secondary | ICD-10-CM

## 2023-12-29 DIAGNOSIS — G893 Neoplasm related pain (acute) (chronic): Secondary | ICD-10-CM | POA: Diagnosis not present

## 2023-12-29 DIAGNOSIS — R911 Solitary pulmonary nodule: Secondary | ICD-10-CM | POA: Diagnosis not present

## 2023-12-29 DIAGNOSIS — R935 Abnormal findings on diagnostic imaging of other abdominal regions, including retroperitoneum: Secondary | ICD-10-CM | POA: Diagnosis not present

## 2023-12-29 DIAGNOSIS — J90 Pleural effusion, not elsewhere classified: Secondary | ICD-10-CM | POA: Insufficient documentation

## 2023-12-29 DIAGNOSIS — Z79899 Other long term (current) drug therapy: Secondary | ICD-10-CM | POA: Diagnosis not present

## 2023-12-29 LAB — CEA (ACCESS): CEA (CHCC): 1.45 ng/mL (ref 0.00–5.00)

## 2023-12-30 ENCOUNTER — Other Ambulatory Visit: Payer: Self-pay | Admitting: Physician Assistant

## 2023-12-30 ENCOUNTER — Telehealth: Payer: Self-pay | Admitting: Physician Assistant

## 2023-12-30 DIAGNOSIS — C3491 Malignant neoplasm of unspecified part of right bronchus or lung: Secondary | ICD-10-CM

## 2023-12-30 LAB — PSA, TOTAL AND FREE
PSA, Free Pct: 18.5 %
PSA, Free: 1.59 ng/mL
Prostate Specific Ag, Serum: 8.6 ng/mL — ABNORMAL HIGH (ref 0.0–4.0)

## 2023-12-30 LAB — CANCER ANTIGEN 19-9: CA 19-9: 42 U/mL — ABNORMAL HIGH (ref 0–35)

## 2023-12-30 NOTE — Telephone Encounter (Signed)
 Marland Kitchen

## 2024-01-06 ENCOUNTER — Encounter (HOSPITAL_COMMUNITY)
Admission: RE | Admit: 2024-01-06 | Discharge: 2024-01-06 | Disposition: A | Discharge status: Home / Self Care | Source: Ambulatory Visit | Attending: Physician Assistant | Admitting: Physician Assistant

## 2024-01-06 DIAGNOSIS — C3490 Malignant neoplasm of unspecified part of unspecified bronchus or lung: Secondary | ICD-10-CM | POA: Diagnosis not present

## 2024-01-06 DIAGNOSIS — C786 Secondary malignant neoplasm of retroperitoneum and peritoneum: Secondary | ICD-10-CM

## 2024-01-06 DIAGNOSIS — C3491 Malignant neoplasm of unspecified part of right bronchus or lung: Secondary | ICD-10-CM | POA: Diagnosis not present

## 2024-01-06 LAB — GLUCOSE, CAPILLARY: Glucose-Capillary: 158 mg/dL — ABNORMAL HIGH (ref 70–99)

## 2024-01-06 MED ORDER — FLUDEOXYGLUCOSE F - 18 (FDG) INJECTION
9.8300 | Freq: Once | INTRAVENOUS | Status: AC
  Administered 2024-01-06: 9.83 via INTRAVENOUS

## 2024-01-11 ENCOUNTER — Telehealth: Payer: Self-pay | Admitting: Physician Assistant

## 2024-01-11 ENCOUNTER — Encounter: Payer: Self-pay | Admitting: Acute Care

## 2024-01-11 ENCOUNTER — Ambulatory Visit: Admitting: Acute Care

## 2024-01-11 VITALS — BP 129/74 | HR 69 | Ht 67.0 in | Wt 195.6 lb

## 2024-01-11 DIAGNOSIS — Z85118 Personal history of other malignant neoplasm of bronchus and lung: Secondary | ICD-10-CM | POA: Diagnosis not present

## 2024-01-11 DIAGNOSIS — Z87891 Personal history of nicotine dependence: Secondary | ICD-10-CM

## 2024-01-11 DIAGNOSIS — Z902 Acquired absence of lung [part of]: Secondary | ICD-10-CM

## 2024-01-11 DIAGNOSIS — J449 Chronic obstructive pulmonary disease, unspecified: Secondary | ICD-10-CM | POA: Diagnosis not present

## 2024-01-11 DIAGNOSIS — R911 Solitary pulmonary nodule: Secondary | ICD-10-CM | POA: Diagnosis not present

## 2024-01-11 DIAGNOSIS — R933 Abnormal findings on diagnostic imaging of other parts of digestive tract: Secondary | ICD-10-CM

## 2024-01-11 DIAGNOSIS — J42 Unspecified chronic bronchitis: Secondary | ICD-10-CM

## 2024-01-11 DIAGNOSIS — C3490 Malignant neoplasm of unspecified part of unspecified bronchus or lung: Secondary | ICD-10-CM

## 2024-01-11 MED ORDER — ALBUTEROL SULFATE HFA 108 (90 BASE) MCG/ACT IN AERS
1.0000 | INHALATION_SPRAY | Freq: Four times a day (QID) | RESPIRATORY_TRACT | 3 refills | Status: DC | PRN
Start: 2024-01-11 — End: 2024-03-30

## 2024-01-11 NOTE — Progress Notes (Signed)
 History of Present Illness Leonard Walton is a 73 y.o. male former smoker with history of stage I non-small cell lung cancer, adenocarcinoma. This was resected in November 2024 and the patient was  undergoing surveillance CT's of the chest . Now with recent suspicion of metastatic disease to the abdomen. He is followed by Dr. Baldwin Levee  Synopsis 73 year old gentleman with a history of tobacco use and COPD, right lower lobe adenocarcinoma diagnosed by navigational bronchoscopy and treated with surgical resection by Dr. Luna Salinas 07/2023. He also had a left lower lobe nodule that was negative on bronchoscopy that will need to be followed. He has been started on Breztri  and reports that he is breathing well. His voice has been weaker since surgery. His exertional tolerance is a bit lower - can get sob with shopping. No albuterol  use. No real cough except at night when he lays down. No overt GERD sx.   Unfortunately, the patient had a routine follow up with pulmonary medicine 12/2023 and was endorsing some abdominal distention. Therefore, Dr. Baldwin Levee added a CT of the AP to his upcoming CT chest super D. This was done 12/21/2023. Unfortunately, the scan showed extensive metastatic cancer in the abdomen. Of note, the patient's last colonoscopy was a few years ago. He states he had a few polyps. He believes he was seen at [facility]. He also been seeing Dr. Freddi Jaeger at [facility] urology for a bladder lesion that was biopsied and negative back in fall 2024.   His right sided nodule was surgically resected. Pulmonary  are still following a small left-sided nodule which is stable on the 12/21/2023 scan.We will continue to maintain surveillance with imaging.   01/11/2024 Pt. Presents for  follow up of CT Chest to reassess the 5 mm pulmonary nodule favored to represent a subpleural lymph node. The nodule is stable on the 12/21/2023 Imaging. Plan will be for a 6 month follow up.   We discussed at length the results of the PET  scan and the abdomen and pelvis Ct scan. There is concern for extensive metastatic cancer in the abdomen. I have messaged with Cassandra Heilingoetter PA with oncology, and plan is for a biopsy the peritoneal area. Oncology have reached out to the radiology scheduler to get the procedure scheduled. Cassandra spoke with the patient and he is on board with the plan.   Pt. Does endorse worsening shortness of breath. He states he is compliant with his Breztri  2 puffs twice daily. He states he is not using his albuterol  for rescue. I have encouraged him to use this for breakthrough shortness of breath as needed. I have sent in refills on the prescription as he feels his current inhaler is most likely expired.   I have told [name] that if the breathing gets worse, or does not improve we can consider PFT's to better evaluate.   Test Results:  PET Scan 01/06/2024 Development of nodular areas of soft tissue along the upper abdominal mesentery with abnormal uptake worrisome for developing peritoneal carcinomatosis. There is some mild ascites as well.   Prior right lobectomy with persistent right-sided pleural effusion with loculations. No abnormal uptake along lymph nodes in the thorax or along the lung parenchyma.   Small asymmetric area of nodular uptake along the right nasopharynx. This is nonspecific. Please correlate with direct visualization.   Persistent changes BPH with enlarged prostate, urinary bladder wall thickening and a bladder diverticulum. Colonic diverticula.   Prominent external iliac chain nodes bilaterally are stable  in size by CT but do not show abnormal uptake.      CT Chest 12/21/2023 Cardiovascular: Aortic atherosclerosis. Normal heart size, without pericardial effusion. Left main and 3 vessel coronary artery calcification.   Mediastinum/Nodes: Calcified right paratracheal nodes likely related to old granulomatous disease. No mediastinal or hilar adenopathy, given  limitations of unenhanced CT.   Lungs/Pleura: New small, mildly loculated right-sided pleural effusion. No pneumothorax.   Right lower lobectomy.   Subpleural left lower lobe 5 mm pulmonary nodule on 98/5 is similar to the prior, favored to represent a subpleural lymph node.   Upper Abdomen: Deferred to abdominal CT dictated separately.   Musculoskeletal: Mid and lower thoracic spondylosis.   IMPRESSION: 1. Interval right lower lobectomy, without recurrent or metastatic disease. 2. Small, loculated right-sided pleural effusion. 3. Coronary artery atherosclerosis. Aortic Atherosclerosis (ICD10-I70.0). 4. Please see abdominopelvic CT of same date, dictated separately.      CT Abdomen 12/21/2023 New trace pelvic free fluid with omental caking/nodularity greatest on the right, concerning for carcinomatosis. 2. Short segment sigmoid colonic wall thickening, which may be secondary to underdistention or colitis suggest clinical correlation and consider further evaluation with colonoscopy if not recently performed to exclude underlying mass lesion. 3. Stable prominent external iliac lymph nodes, favored reactive but technically nonspecific. Suggest attention on follow-up imaging. 4. Nonobstructive 2-3 mm left lower pole renal stone. 5. Heterogeneous enhancement of an enlarged prostate gland, nonspecific. 6. Multiple bladder wall diverticula, which may reflect sequela of chronic bladder outlet obstruction. 7. Aortic atherosclerosis.     Latest Ref Rng & Units 10/03/2023    1:35 PM 08/10/2023    2:19 AM 08/09/2023    2:41 AM  CBC  WBC 4.0 - 10.5 K/uL 8.9  12.8  17.3   Hemoglobin 13.0 - 17.0 g/dL 96.2  95.2  84.1   Hematocrit 39.0 - 52.0 % 45.9  41.2  39.8   Platelets 150 - 400 K/uL 227  201  217        Latest Ref Rng & Units 10/03/2023    1:35 PM 08/10/2023    2:19 AM 08/09/2023    2:41 AM  BMP  Glucose 70 - 99 mg/dL 324  401  027   BUN 8 - 23 mg/dL 12  11  8     Creatinine 0.61 - 1.24 mg/dL 2.53  6.64  4.03   Sodium 135 - 145 mmol/L 137  137  136   Potassium 3.5 - 5.1 mmol/L 4.2  3.6  4.0   Chloride 98 - 111 mmol/L 103  106  105   CO2 22 - 32 mmol/L 29  23  24    Calcium  8.9 - 10.3 mg/dL 47.4  8.5  8.6     BNP No results found for: "BNP"  ProBNP No results found for: "PROBNP"  PFT    Component Value Date/Time   FEV1PRE 2.09 05/30/2023 1449   FEV1POST 1.05 05/30/2023 1449   FVCPRE 3.04 05/30/2023 1449   FVCPOST 3.28 05/30/2023 1449   TLC 5.88 05/30/2023 1449   DLCOUNC 19.67 05/30/2023 1449   PREFEV1FVCRT 69 05/30/2023 1449   PSTFEV1FVCRT 32 05/30/2023 1449    NM PET Image Restage (PS) Skull Base to Thigh (F-18 FDG) Result Date: 01/10/2024 CLINICAL DATA:  Follow up treatment strategy for non-small-cell lung cancer. Previous lobectomy. EXAM: NUCLEAR MEDICINE PET SKULL BASE TO THIGH TECHNIQUE: 9.83 mCi F-18 FDG was injected intravenously. Full-ring PET imaging was performed from the skull base to thigh after the radiotracer. CT  data was obtained and used for attenuation correction and anatomic localization. Fasting blood glucose: 158 mg/dl COMPARISON:  CT 42/59/5638 and older.  PET-CT scan 06/30/2023. FINDINGS: Mediastinal blood pool activity: SUV max 2.8 Liver activity: SUV max 3.9 NECK: No specific abnormal uptake identified neck including along lymph node change of the submandibular, posterior triangle or internal jugular regions. Mild asymmetric uptake along the right nasopharynx. Minimal thickening in this location on CT. Please correlate with direct visualization. This is with maximum SUV value of 6.3. There is symmetric uptake along the visualized intracranial compartment. Incidental CT findings: The parotid glands, submandibular glands are unremarkable. Small thyroid  gland. There is streak artifact related to the patient's dental hardware. Visualized paranasal sinuses and mastoid air cells are clear. CHEST: No abnormal uptake above blood  pool in the axillary region, hilum or mediastinum. No abnormal lung uptake. Incidental CT findings: Coronary calcifications are seen. Trace pericardial fluid, decreasing from previous CT scan. Normal caliber thoracic esophagus. Prominent calcifications along the thoracic aorta and the great vessels particularly the origin of the right subclavian artery. There is some small mediastinal nodes which are not hypermetabolic and unchanged. Persistent small to moderate right pleural effusion with some loculated components. Surgical changes of right-sided lobectomy. Left lung is without consolidation, pneumothorax or effusion. There is a small left lower lobe lung nodule measuring 7 mm in maximal axial dimension on image 87 of series 4, unchanged from previous and does not show any abnormal uptake. Continued follow-up surveillance with CT. ABDOMEN/PELVIS: There is physiologic distribution along the parenchymal organs. Preserved uptake along the ureters and renal collecting systems. There is a again right-sided superior bladder diverticulum identified. Bladder wall is thickened and trabeculated. There is an enlarged prostate. Please correlate for with clinical history. The bowel itself has normal physiologic uptake. However there are adjacent areas of nodular tissue with abnormal uptake identified in the upper abdomen adjacent to portions of the transverse colon. These were not seen on the prior PET-CT scan. However were present on the more recent abdomen pelvis CT with contrast. For example the left upper quadrant nodular area was measured at that study with diameter approaching 2.6 cm and today 3.1 cm. This has maximum SUV value of 10.5. Areas in the right upper anterior abdomen is the larger area of abnormal tissue and has uptake approaching 7.8 maximum SUV. Area on image 129 of the CT scan measures proximally 8.2 by 3.5 cm. Some of the areas of nodular thickening along the mesentery. Trace ascites. Findings are worrisome  for developing peritoneal carcinomatosis. Incidental CT findings: Colonic diverticula. No bowel obstruction. Scattered colonic stool. There is some wall thickening along the sigmoid colon where there is more intense colonic diverticulosis. This could be circular muscle hypertrophy. This area does not show significant increased radiotracer uptake relative to other areas of bowel. Nonobstructing lower pole small left-sided renal stones. No ureteral stones. Diffuse vascular calcifications. Gallbladder is nondilated. Stable prominent bilateral external iliac chain nodes. These are similar size to the recent prior examination but do not show significant abnormal uptake. SKELETON: No abnormal uptake along the visualized osseous structures. Scattered degenerative changes. IMPRESSION: Development of nodular areas of soft tissue along the upper abdominal mesentery with abnormal uptake worrisome for developing peritoneal carcinomatosis. There is some mild ascites as well. Prior right lobectomy with persistent right-sided pleural effusion with loculations. No abnormal uptake along lymph nodes in the thorax or along the lung parenchyma. Small asymmetric area of nodular uptake along the right nasopharynx. This is nonspecific.  Please correlate with direct visualization. Persistent changes BPH with enlarged prostate, urinary bladder wall thickening and a bladder diverticulum. Colonic diverticula. Prominent external iliac chain nodes bilaterally are stable in size by CT but do not show abnormal uptake. Electronically Signed   By: Adrianna Horde M.D.   On: 01/10/2024 16:51   CT Super D Chest Wo Contrast Result Date: 12/29/2023 CLINICAL DATA:  Right lower lobectomy for small-cell carcinoma. * Tracking Code: BO * EXAM: CT CHEST WITHOUT CONTRAST TECHNIQUE: Multidetector CT imaging of the chest was performed using thin slice collimation for electromagnetic bronchoscopy planning purposes, without intravenous contrast. RADIATION DOSE  REDUCTION: This exam was performed according to the departmental dose-optimization program which includes automated exposure control, adjustment of the mA and/or kV according to patient size and/or use of iterative reconstruction technique. COMPARISON:  06/30/2023 PET.  Most recent chest CT of 06/02/2023. FINDINGS: Cardiovascular: Aortic atherosclerosis. Normal heart size, without pericardial effusion. Left main and 3 vessel coronary artery calcification. Mediastinum/Nodes: Calcified right paratracheal nodes likely related to old granulomatous disease. No mediastinal or hilar adenopathy, given limitations of unenhanced CT. Lungs/Pleura: New small, mildly loculated right-sided pleural effusion. No pneumothorax. Right lower lobectomy. Subpleural left lower lobe 5 mm pulmonary nodule on 98/5 is similar to the prior, favored to represent a subpleural lymph node. Upper Abdomen: Deferred to abdominal CT dictated separately. Musculoskeletal: Mid and lower thoracic spondylosis. IMPRESSION: 1. Interval right lower lobectomy, without recurrent or metastatic disease. 2. Small, loculated right-sided pleural effusion. 3. Coronary artery atherosclerosis. Aortic Atherosclerosis (ICD10-I70.0). 4. Please see abdominopelvic CT of same date, dictated separately. Electronically Signed   By: Lore Rode M.D.   On: 12/29/2023 09:09   CT ABDOMEN PELVIS W CONTRAST Result Date: 12/25/2023 CLINICAL DATA:  Right-sided/right lower quadrant abdominal pain. 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 * Tracking Code: BO * EXAM: CT ABDOMEN AND PELVIS WITH CONTRAST TECHNIQUE: Multidetector CT imaging of the abdomen and pelvis was performed using the standard protocol following bolus administration of intravenous contrast. RADIATION DOSE REDUCTION: This exam was performed according to the departmental dose-optimization program which includes automated exposure control, adjustment of the mA and/or kV according to patient size  and/or use of iterative reconstruction technique. CONTRAST:  ISOVUE -300 IOPAMIDOL  (ISOVUE -300) INJECTION 61% COMPARISON:  PET-CT June 30, 2023 and CT abdomen and pelvis foot November 04, 2022. FINDINGS: Lower chest: Please refer to same day CT of the chest for findings above the diaphragm. Hepatobiliary: No suspicious hepatic lesion. Gallbladder is unremarkable. No biliary ductal dilation. Pancreas: No pancreatic ductal dilation or evidence of acute inflammation. Spleen: No splenomegaly or focal splenic lesion. Adrenals/Urinary Tract: No suspicious adrenal nodule/mass. Nonobstructive 2-3 mm left lower pole renal stone. Kidneys demonstrate symmetric enhancement. No hydronephrosis. Multiple bladder wall diverticula. Stomach/Bowel: Stomach is nondistended limiting evaluation. No pathologic dilation of small or large bowel. Colonic diverticulosis. Short segment sigmoid colonic wall thickening. Vascular/Lymphatic: Aortic atherosclerosis. Normal caliber abdominal aorta. Smooth IVC contours. The portal, splenic and superior mesenteric veins are patent. No pathologically enlarged abdominal or pelvic lymph nodes. Prominent hyperenhancing bilateral external iliac lymph nodes are stable from CT November 04, 2022 measuring 7 mm in short axis on the left and 5 mm in short axis on the right on image 74/2, favored reactive but nonspecific. Reproductive: Heterogeneous enhancement of an enlarged prostate gland Other: New trace pelvic free fluid with omental caking/nodularity greatest on the right on image 42/2. tiny fat containing umbilical hernia. Musculoskeletal: No aggressive lytic or blastic lesion of bone.  Multilevel degenerative changes of the spine. IMPRESSION: 1. New trace pelvic free fluid with omental caking/nodularity greatest on the right, concerning for carcinomatosis. 2. Short segment sigmoid colonic wall thickening, which may be secondary to underdistention or colitis suggest clinical correlation and consider  further evaluation with colonoscopy if not recently performed to exclude underlying mass lesion. 3. Stable prominent external iliac lymph nodes, favored reactive but technically nonspecific. Suggest attention on follow-up imaging. 4. Nonobstructive 2-3 mm left lower pole renal stone. 5. Heterogeneous enhancement of an enlarged prostate gland, nonspecific. 6. Multiple bladder wall diverticula, which may reflect sequela of chronic bladder outlet obstruction. 7. Aortic atherosclerosis. These results will be called to the ordering clinician or representative by the Radiologist Assistant, and communication documented in the PACS or [employer]. Electronically Signed   By: Tama Fails M.D.   On: 12/25/2023 11:14     Past medical hx Past Medical History:  Diagnosis Date   Alcohol abuse    hx acute alcoholism,  per pcp note in care everywhere pt has cut down but not quit   B12 deficiency    Benign localized prostatic hyperplasia with lower urinary tract symptoms (LUTS)    urologist--- dr gay   Bladder calculus    Cigarette nicotine dependence    COPD (chronic obstructive pulmonary disease) (HCC)    pulmonolgy --- dr byrum   History of Helicobacter pylori infection 2015   treated   Hyperlipidemia, mixed    Hypothyroidism    followed pcp   Lesion of urinary bladder    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   OA (osteoarthritis)    knees/ hands/ shoulders/ elbows/  hips   Type 2 diabetes mellitus (HCC)    followed by pcp    (07-27-2023  pt stated checks blood sugar daily am fasting,  average blood sugar 100--120s)   Vitamin D deficiency    Wears dentures    upper full   Wears glasses      Social History   Tobacco Use   Smoking status: Former    Current packs/day: 1.00    Average packs/day: 1 pack/day for 57.3 years (57.3 ttl pk-yrs)    Types: Cigarettes    Start date: 1968   Smokeless tobacco: Never   Tobacco comments:     Currently quit/ trying to quit  Vaping Use   Vaping status: Former   Devices: 07-27-2023  per pt tried for 3 wks approx  2020 e-cig , none since  Substance Use Topics   Alcohol use: Yes    Alcohol/week: 10.0 - 14.0 standard drinks of alcohol    Types: 10 - 14 Shots of liquor per week    Comment: 07-27-2023 per pt 2 per day, bourbon/ coke,  average 10-14   Drug use: Never    Mr.Lorenzetti reports that he has quit smoking. His smoking use included cigarettes. He started smoking about 57 years ago. He has a 57.3 pack-year smoking history. He has never used smokeless tobacco. He reports current alcohol use of about 10.0 - 14.0 standard drinks of alcohol per week. He reports that he does not use drugs.  Tobacco Cessation: Counseling given: Not Answered Tobacco comments: Currently quit/ trying to quit Former smoker , quit 07/2023 with a 57.3 pack year smoking history.  Past surgical hx, Family hx, Social hx all reviewed.  Current Outpatient Medications on File Prior to Visit  Medication Sig   albuterol  (VENTOLIN  HFA) 108 (90  Base) MCG/ACT inhaler Inhale 1-2 puffs into the lungs every 6 (six) hours as needed for wheezing or shortness of breath.   aspirin  EC 81 MG tablet Take 81 mg by mouth daily.   b complex vitamins capsule Take 1 capsule by mouth daily.   Budeson-Glycopyrrol-Formoterol  (BREZTRI  AEROSPHERE) 160-9-4.8 MCG/ACT AERO Inhale 2 puffs into the lungs in the morning and at bedtime.   Cholecalciferol (VITAMIN D3) 50 MCG (2000 UT) capsule Take 2,000 Units by mouth daily.   docusate sodium  (COLACE) 100 MG capsule Take 1 capsule (100 mg total) by mouth daily as needed for up to 30 doses.   guaiFENesin  (MUCINEX ) 600 MG 12 hr tablet Take 1 tablet (600 mg total) by mouth 2 (two) times daily as needed.   levothyroxine  (SYNTHROID ) 88 MCG tablet Take 88 mcg by mouth daily before breakfast.   Magnesium 250 MG CAPS Take 250 mg by mouth daily.   metFORMIN  (GLUCOPHAGE ) 500 MG tablet Take 500 mg by  mouth 2 (two) times daily with a meal.   naproxen sodium (ALEVE) 220 MG tablet Take 220 mg by mouth.   oxyCODONE -acetaminophen  (PERCOCET) 5-325 MG tablet Take 1 tablet by mouth every 4 (four) hours as needed for up to 18 doses for severe pain (pain score 7-10).   rosuvastatin  (CRESTOR ) 5 MG tablet Take 5 mg by mouth daily.   tamsulosin  (FLOMAX ) 0.4 MG CAPS capsule Take 0.4 mg by mouth daily.   pregabalin (LYRICA) 50 MG capsule 1 capsule Orally Once a day for 30 days (Patient not taking: Reported on 11/23/2023)   No current facility-administered medications on file prior to visit.     Allergies  Allergen Reactions   Finasteride Other (See Comments)    dizziness    Review Of Systems:  Constitutional:   No  weight loss, night sweats,  Fevers, chills, +fatigue, or  lassitude.  HEENT:   No headaches,  Difficulty swallowing,  Tooth/dental problems, or  Sore throat,                No sneezing, itching, ear ache, nasal congestion, post nasal drip,   CV:  No chest pain,  Orthopnea, PND, swelling in lower extremities, anasarca, dizziness, palpitations, syncope.   GI  No heartburn, indigestion, + abdominal pain, nausea, vomiting, diarrhea, change in bowel habits, loss of appetite, bloody stools. + abdominal distension, + fullness to right side or abdomen, feels like a superficial mass  Resp: + shortness of breath with exertion less at rest.  No excess mucus, no productive cough,  No non-productive cough,  No coughing up of blood.  No change in color of mucus.  No wheezing.  No chest wall deformity  Skin: no rash or lesions.  GU: no dysuria, change in color of urine, no urgency or frequency.  No flank pain, no hematuria   MS:  No joint pain or swelling.  No decreased range of motion.  No back pain.  Psych:  No change in mood or affect. No depression or anxiety.  No memory loss.   Vital Signs BP 129/74 (BP Location: Left Arm, Patient Position: Sitting, Cuff Size: Normal)   Pulse 69   Ht 5'  7" (1.702 m)   Wt 195 lb 9.6 oz (88.7 kg)   SpO2 96%   BMI 30.64 kg/m    Physical Exam:  General- No distress,  A&Ox3, pleasant ENT: No sinus tenderness, TM clear, pale nasal mucosa, no oral exudate,no post nasal drip, no LAN Cardiac: S1, S2, regular rate and  rhythm, no murmur Chest: No wheeze/ rales/ dullness; no accessory muscle use, no nasal flaring, no sternal retractions, diminished per bases Abd.: Soft , slightly tender to right side,+ distension, BS +, Body mass index is 30.64 kg/m. , + fullness to right side or abdomen, feels like a superficial mass Ext: No clubbing cyanosis, edema, no obvious deformities Neuro:  normal strength, MAE x 4, A&O x 3, appropriate Skin: No rashes, warm and dry, no obvious lesions  Psych: normal mood and behavior, appropriately concerned   Assessment/Plan Stable 5 mm Pulmonary nodule Plan 6 month Follow up CT chest for continue surveillance of the small left lower lobe pulmonary nodule.   Right Upper Quadrant abdominal Fullness  Tender to touch New trace pelvic free fluid with omental caking/nodularity greatest on the right, concerning for carcinomatosis/ Recurrent metastatic disease  Plan Follow up with Dr. Marguerita Shih as is scheduled Biopsy the peritoneal area, oncology to schedule with radiology Please see if you can get a colonoscopy scheduled with [facility] based on the PET scan results.  Explain to them that you have had a cancer diagnosis since the last colonoscopy.   COPD Plan Continue Breztri  twice daily as you have been doing. Rinse mouth after use.  Use Albuterol  1-2 puffs as needed for shortness of breath or wheezing.   Call us  if your breathing gets worse, and we can order PFT's to evaluate your breathing function.  I have sent in renewal for albuterol  with 3 refills.  I spent 45 minutes dedicated to the care of this patient on the date of this encounter to include pre-visit review of records, face-to-face time with the patient  discussing conditions above, post visit ordering of testing, clinical documentation with the electronic health record, making appropriate referrals as documented, and communicating necessary information to the patient's healthcare team.   Raejean Bullock, NP 01/11/2024  8:46 AM

## 2024-01-11 NOTE — Telephone Encounter (Signed)
 I reviewed the patient's PET scan with Dr. Marguerita Shih and called the patient to review.  In the right upper abdomen has a large area of abnormal tissue uptake that we feel would be the most accessible for biopsy.  I called the patient and he is in agreement with this.  I will place a order for an ultrasound-guided biopsy of the peritoneum.  I will also reach out to radiology to help facilitate getting this scheduled.  As of right now I will keep his appointment as scheduled but once I know the date of his biopsy I may reschedule his follow-up visit for after we have the results of the biopsy.

## 2024-01-11 NOTE — Patient Instructions (Addendum)
 It is good to see you today. 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 I will talk with Dr. Baldwin Levee about plans for possible biopsy, most likely the free fluid on the right side of the abdomen. I have messaged oncology, so they are aware of the findings of the PET scan. Oncology will order the appropriate biopsy. Please see if you can get a colonoscopy scheduled with [facility] based on the PET scan results.  Explain to them that you have had a cancer diagnosis since the last colonoscopy.  Continue Breztri  twice daily as you have been doing. Rinse mouth after use.  Use Albuterol  1-2 puffs as needed for shortness of breath or wheezing.   Call us  if your breathing gets worse, and we can order PFT's to evaluate your breathing function.  I have sent in renewal with 3 refills. 6 month Follow up CT chest for continue surveillance of the small left lower lobe pulmonary nodule.  Please contact office for sooner follow up if symptoms do not improve or worsen or seek emergency care

## 2024-01-12 ENCOUNTER — Other Ambulatory Visit: Payer: Self-pay | Admitting: Physician Assistant

## 2024-01-12 ENCOUNTER — Telehealth: Payer: Self-pay | Admitting: Physician Assistant

## 2024-01-12 DIAGNOSIS — C786 Secondary malignant neoplasm of retroperitoneum and peritoneum: Secondary | ICD-10-CM

## 2024-01-12 NOTE — Progress Notes (Unsigned)
 Suttle, Antonia Battiest, MD  Heilingoetter, Leita Purdue, PA-C; Liberty Reed L PROCEDURE / BIOPSY REVIEW Date: 01/12/24  Requested Biopsy site: Anterior RLQ omental mass Reason for request: hx of lung ca, peritoneal carcinomatosis Imaging review: Best seen on PET (01/06/24) #604, 127/325  Decision: Approved Imaging modality to perform: CT Schedule with: Moderate Sedation Schedule for: Any VIR  Additional comments: None  Please contact me with questions, concerns, or if issue pertaining to this request arise.  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 I apologize for the error.

## 2024-01-12 NOTE — Telephone Encounter (Signed)
 Rescheduled appointments and is aware of the appointment changes.

## 2024-01-19 ENCOUNTER — Ambulatory Visit: Admitting: Physician Assistant

## 2024-01-19 ENCOUNTER — Other Ambulatory Visit

## 2024-01-20 ENCOUNTER — Other Ambulatory Visit: Payer: Self-pay | Admitting: Student

## 2024-01-20 ENCOUNTER — Other Ambulatory Visit: Payer: Self-pay | Admitting: Radiology

## 2024-01-20 DIAGNOSIS — K668 Other specified disorders of peritoneum: Secondary | ICD-10-CM

## 2024-01-23 ENCOUNTER — Other Ambulatory Visit: Payer: Self-pay

## 2024-01-23 ENCOUNTER — Encounter (HOSPITAL_COMMUNITY): Payer: Self-pay

## 2024-01-23 ENCOUNTER — Ambulatory Visit (HOSPITAL_COMMUNITY)
Admission: RE | Admit: 2024-01-23 | Discharge: 2024-01-23 | Disposition: A | Discharge status: Home / Self Care | Source: Ambulatory Visit | Attending: Physician Assistant | Admitting: Physician Assistant

## 2024-01-23 DIAGNOSIS — R188 Other ascites: Secondary | ICD-10-CM | POA: Insufficient documentation

## 2024-01-23 DIAGNOSIS — K668 Other specified disorders of peritoneum: Secondary | ICD-10-CM | POA: Diagnosis not present

## 2024-01-23 DIAGNOSIS — Z87891 Personal history of nicotine dependence: Secondary | ICD-10-CM | POA: Diagnosis not present

## 2024-01-23 DIAGNOSIS — C3490 Malignant neoplasm of unspecified part of unspecified bronchus or lung: Secondary | ICD-10-CM | POA: Diagnosis not present

## 2024-01-23 DIAGNOSIS — N3289 Other specified disorders of bladder: Secondary | ICD-10-CM | POA: Insufficient documentation

## 2024-01-23 DIAGNOSIS — N323 Diverticulum of bladder: Secondary | ICD-10-CM | POA: Diagnosis not present

## 2024-01-23 DIAGNOSIS — J90 Pleural effusion, not elsewhere classified: Secondary | ICD-10-CM | POA: Insufficient documentation

## 2024-01-23 DIAGNOSIS — C3491 Malignant neoplasm of unspecified part of right bronchus or lung: Secondary | ICD-10-CM | POA: Diagnosis not present

## 2024-01-23 DIAGNOSIS — K5730 Diverticulosis of large intestine without perforation or abscess without bleeding: Secondary | ICD-10-CM | POA: Insufficient documentation

## 2024-01-23 DIAGNOSIS — C786 Secondary malignant neoplasm of retroperitoneum and peritoneum: Secondary | ICD-10-CM | POA: Diagnosis not present

## 2024-01-23 LAB — CBC
HCT: 48.1 % (ref 39.0–52.0)
Hemoglobin: 16.4 g/dL (ref 13.0–17.0)
MCH: 33.5 pg (ref 26.0–34.0)
MCHC: 34.1 g/dL (ref 30.0–36.0)
MCV: 98.4 fL (ref 80.0–100.0)
Platelets: 357 10*3/uL (ref 150–400)
RBC: 4.89 MIL/uL (ref 4.22–5.81)
RDW: 12.8 % (ref 11.5–15.5)
WBC: 9.4 10*3/uL (ref 4.0–10.5)
nRBC: 0 % (ref 0.0–0.2)

## 2024-01-23 LAB — GLUCOSE, CAPILLARY: Glucose-Capillary: 160 mg/dL — ABNORMAL HIGH (ref 70–99)

## 2024-01-23 LAB — PROTIME-INR
INR: 1 (ref 0.8–1.2)
Prothrombin Time: 13.9 s (ref 11.4–15.2)

## 2024-01-23 MED ORDER — MIDAZOLAM HCL 2 MG/2ML IJ SOLN
INTRAMUSCULAR | Status: AC | PRN
  Administered 2024-01-23: 1 mg via INTRAVENOUS
  Administered 2024-01-23: .5 mg via INTRAVENOUS

## 2024-01-23 MED ORDER — LIDOCAINE HCL 1 % IJ SOLN
10.0000 mL | Freq: Once | INTRAMUSCULAR | Status: AC
  Administered 2024-01-23: 10 mL via INTRADERMAL

## 2024-01-23 MED ORDER — FENTANYL CITRATE (PF) 100 MCG/2ML IJ SOLN
INTRAMUSCULAR | Status: AC
Start: 2024-01-23 — End: ?
  Filled 2024-01-23: qty 2

## 2024-01-23 MED ORDER — FENTANYL CITRATE (PF) 100 MCG/2ML IJ SOLN
INTRAMUSCULAR | Status: AC | PRN
  Administered 2024-01-23: 25 ug via INTRAVENOUS
  Administered 2024-01-23: 50 ug via INTRAVENOUS

## 2024-01-23 MED ORDER — SODIUM CHLORIDE 0.9 % IV SOLN: INTRAVENOUS | Status: DC

## 2024-01-23 MED ORDER — MIDAZOLAM HCL 2 MG/2ML IJ SOLN
INTRAMUSCULAR | Status: AC
  Filled 2024-01-23: qty 2

## 2024-01-23 NOTE — Procedures (Signed)
 Interventional Radiology Procedure Note  Procedure: CT CORE BX RT OMENTAL MASS/CARCINOMATOSIS    Complications: None  Estimated Blood Loss:  0  Findings: 78 G CORES IN FORMALIN    M. TREVOR Tarrie Mcmichen, MD

## 2024-01-23 NOTE — H&P (Addendum)
 Chief Complaint: Patient was seen in consultation today for omental thickening  Referring Physician(s): Heilingoetter,Cassandra L  Supervising Physician: Alyssa Jumper  Patient Status: [facility] - Out-pt  History of Present Illness: Leonard Walton is a 73 y.o. male ormer smoker with history of stage I non-small cell lung cancer, adenocarcinoma s/p resection in Nov 2024.  Recent surveillance imaging shows concern for abdominal metastasis.   PET 01/10/24: Development of nodular areas of soft tissue along the upper abdominal mesentery with abnormal uptake worrisome for developing peritoneal carcinomatosis. There is some mild ascites as well.   Prior right lobectomy with persistent right-sided pleural effusion with loculations. No abnormal uptake along lymph nodes in the thorax or along the lung parenchyma.   Small asymmetric area of nodular uptake along the right nasopharynx. This is nonspecific. Please correlate with direct visualization.   Persistent changes BPH with enlarged prostate, urinary bladder wall thickening and a bladder diverticulum. Colonic diverticula.   Prominent external iliac chain nodes bilaterally are stable in size by CT but do not show abnormal uptake.  IR consulted for biopsy at the request of Cassandra Heillingoetter, PA-C.   Patient case reviewed and approved by Dr. Jinx Mourning.    Patient presents to [facility] today accompanied by his family  He is understanding of the procedure today and is agreeable to proceed.  Family is available for post-procedure care and transportation.  He has been NPO.  He DID NOT hold aspirin .   Patient is FULL CODE.   Past Medical History:  Diagnosis Date   Alcohol abuse    hx acute alcoholism,  per pcp note in care everywhere pt has cut down but not quit   B12 deficiency    Benign localized prostatic hyperplasia with lower urinary tract symptoms (LUTS)    urologist--- dr gay   Bladder calculus    Cigarette nicotine  dependence    COPD (chronic obstructive pulmonary disease) (HCC)    pulmonolgy --- dr byrum   History of Helicobacter pylori infection 2015   treated   Hyperlipidemia, mixed    Hypothyroidism    followed pcp   Lesion of urinary bladder    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   OA (osteoarthritis)    knees/ hands/ shoulders/ elbows/  hips   Type 2 diabetes mellitus (HCC)    followed by pcp    (07-27-2023  pt stated checks blood sugar daily am fasting,  average blood sugar 100--120s)   Vitamin D deficiency    Wears dentures    upper full   Wears glasses     Past Surgical History:  Procedure Laterality Date   BRONCHIAL BIOPSY  06/06/2023   Procedure: BRONCHIAL BIOPSIES;  Surgeon: Denson Flake, MD;  Location: [facility] END[facility]COPY;  Service: Pulmonary;;   BRONCHIAL BRUSHINGS  06/06/2023   Procedure: BRONCHIAL BRUSHINGS;  Surgeon: Denson Flake, MD;  Location: [facility] ENDOSCOPY;  Service: Pulmonary;;   BRONCHIAL NEEDLE ASPIRATION BIOPSY  06/06/2023   Procedure: BRONCHIAL NEEDLE ASPIRATION BIOPSIES;  Surgeon: Denson Flake, MD;  Location: [facility] ENDOSCOPY;  Service: Pulmonary;;   COLONOSCOPY  2023   CYSTOSCOPY WITH BIOPSY N/A 08/01/2023   Procedure: CYSTOSCOPY WITH BLADDER BIOPSY, URETHERAl DILATION AND FULGURATION OF PROSTATE;  Surgeon: Lahoma Pigg, MD;  Location: [facility];  Service: Urology;  Laterality: N/A;  60 MINUTES NEEDED FOR CASE   CYSTOSCOPY WITH LITHOLAPAXY N/A 08/01/2023   Procedure: CYSTOLITHOLAPAXY;  Surgeon: Doy Gene  R, MD;  Location: [facility];  Service: Urology;  Laterality: N/A;   FIDUCIAL MARKER PLACEMENT  06/06/2023   Procedure: FIDUCIAL MARKER PLACEMENT;  Surgeon: Denson Flake, MD;  Location: [facility] ENDOSCOPY;  Service: Pulmonary;;   INTERCOSTAL NERVE BLOCK Right 08/08/2023   Procedure: INTERCOSTAL NERVE BLOCK;  Surgeon: Zelphia Higashi, MD;  Location: [facility] OR;  Service: Thoracic;   Laterality: Right;   KNEE SURGERY Right    1966  and 1970s   LUMBAR LAMINECTOMY  10/19/2007   @[facility]  by  Dr. Selestino Dakin;  L3--5   LYMPH NODE BIOPSY Right 08/08/2023   Procedure: LYMPH NODE BIOPSY;  Surgeon: Zelphia Higashi, MD;  Location: [facility] OR;  Service: Thoracic;  Laterality: Right;   TONSILLECTOMY  1962   UPPER GI ENDOSCOPY  2015    Allergies: Finasteride  Medications: Prior to Admission medications   Medication Sig Start Date End Date Taking? Authorizing Provider  albuterol  (VENTOLIN  HFA) 108 (90 Base) MCG/ACT inhaler Inhale 1-2 puffs into the lungs every 6 (six) hours as needed for wheezing or shortness of breath. 01/11/24  Yes Raejean Bullock, NP  aspirin  EC 81 MG tablet Take 81 mg by mouth daily.   Yes [provider]  b complex vitamins capsule Take 1 capsule by mouth daily.   Yes [provider]  Budeson-Glycopyrrol-Formoterol  (BREZTRI  AEROSPHERE) 160-9-4.8 MCG/ACT AERO Inhale 2 puffs into the lungs in the morning and at bedtime. 05/13/23  Yes Denson Flake, MD  Cholecalciferol (VITAMIN D3) 50 MCG (2000 UT) capsule Take 2,000 Units by mouth daily.   Yes [provider]  docusate sodium  (COLACE) 100 MG capsule Take 1 capsule (100 mg total) by mouth daily as needed for up to 30 doses. 08/01/23  Yes Lahoma Pigg, MD  guaiFENesin  (MUCINEX ) 600 MG 12 hr tablet Take 1 tablet (600 mg total) by mouth 2 (two) times daily as needed. 08/11/23  Yes Moira Andrews M, PA-C  levothyroxine  (SYNTHROID ) 88 MCG tablet Take 88 mcg by mouth daily before breakfast. 11/11/14  Yes [provider]  Magnesium 250 MG CAPS Take 250 mg by mouth daily.   Yes [provider]  metFORMIN  (GLUCOPHAGE ) 500 MG tablet Take 500 mg by mouth 2 (two) times daily with a meal. 10/31/14  Yes [provider]  naproxen sodium (ALEVE) 220 MG tablet Take 220 mg by mouth.   Yes [provider]  pregabalin (LYRICA) 50 MG capsule  09/12/23  Yes [provider]  rosuvastatin  (CRESTOR ) 5 MG tablet Take 5 mg by mouth daily. 12/27/19  Yes [provider]  tamsulosin  (FLOMAX ) 0.4 MG CAPS capsule Take 0.4 mg by mouth daily.   Yes [provider]  oxyCODONE -acetaminophen  (PERCOCET) 5-325 MG tablet Take 1 tablet by mouth every 4 (four) hours as needed for up to 18 doses for severe pain (pain score 7-10). 08/01/23   Lahoma Pigg, MD     Family History  Problem Relation Age of Onset   CAD Mother        MI at age 97   Heart attack Mother    Lung cancer Father    Throat cancer Sister    Prostate cancer Brother    Ovarian cancer Sister     Social History   Socioeconomic History   Marital status: Divorced    Spouse name: Not on file   Number of children: 3   Years of education: college   Highest education level: Associate degree: academic program  Occupational History   Occupation: retired from Air[employer] pilot  Tobacco Use   Smoking status: Former    Current packs/day: 1.00    Average packs/day: 1 pack/day for 57.3 years (57.3 ttl pk-yrs)    Types: Cigarettes    Start date: 1968   Smokeless tobacco: Never   Tobacco comments:    Currently quit/ trying to quit  Vaping Use   Vaping status: Former   Devices: 07-27-2023  per pt tried for 3 wks approx  2020 e-cig , none since  Substance and Sexual Activity   Alcohol use: Yes    Alcohol/week: 10.0 - 14.0 standard drinks of alcohol    Types: 10 - 14 Shots of liquor per week    Comment: 07-27-2023 per pt 2 per day, bourbon/ coke,  average 10-14   Drug use: Never   Sexual activity: Yes  Other Topics Concern   Not on file  Social History Narrative   Lives alone.   Right-handed.   Occasional soda.   Social Drivers of Corporate investment banker Strain: Not on file  Food Insecurity: No Food Insecurity (08/08/2023)   Hunger Vital Sign    Worried About Running Out of Food in the Last Year: Never true    Ran Out of Food in the Last Year: Never true  Transportation  Needs: No Transportation Needs (08/08/2023)   PRAPARE - Administrator, Civil Service (Medical): No    Lack of Transportation (Non-Medical): No  Physical Activity: Not on file  Stress: Not on file  Social Connections: Not on file     Review of Systems: A 12 point ROS discussed and pertinent positives are indicated in the HPI above.  All other systems are negative.  Review of Systems  Constitutional:  Negative for fatigue and fever.  Respiratory:  Negative for cough and shortness of breath.   Cardiovascular:  Negative for chest pain.  Gastrointestinal:  Positive for abdominal distention. Negative for abdominal pain.  Musculoskeletal:  Negative for back pain.  Psychiatric/Behavioral:  Negative for behavioral problems and confusion.     Vital Signs: BP (!) 149/81   Pulse 76   Temp 97.6 F (36.4 C) (Oral)   Resp 18   Ht 5\' 7"  (1.702 m)   Wt 193 lb (87.5 kg)   SpO2 96%   BMI 30.23 kg/m   Physical Exam Vitals and nursing note reviewed.  Constitutional:      General: He is not in acute distress.    Appearance: Normal appearance. He is not ill-appearing.  HENT:     Mouth/Throat:     Pharynx: Oropharynx is clear.  Pulmonary:     Effort: Pulmonary effort is normal. No respiratory distress.     Breath sounds: Normal breath sounds.  Abdominal:     General: There is distension (mild fullness).     Palpations: Abdomen is soft.     Tenderness: There is no abdominal tenderness.  Skin:    General: Skin is warm and dry.  Neurological:     General: No focal deficit present.     Mental Status: He is alert and oriented to person, place, and time. Mental status is at baseline.  Psychiatric:        Mood and Affect: Mood normal.        Behavior: Behavior normal.        Thought Content: Thought content normal.        Judgment: Judgment normal.      MD Evaluation  Airway: WNL Heart: WNL Abdomen: WNL Chest/ Lungs: WNL ASA  Classification: 3 Mallampati/Airway Score:  Two   Imaging: NM PET Image Restage (PS) Skull Base to Thigh (F-18 FDG) Result Date: 01/10/2024 CLINICAL DATA:  Follow up treatment strategy for non-small-cell lung cancer. Previous lobectomy. EXAM: NUCLEAR MEDICINE PET SKULL BASE TO THIGH TECHNIQUE: 9.83 mCi F-18 FDG was injected intravenously. Full-ring PET imaging was performed from the skull base to thigh after the radiotracer. CT data was obtained and used for attenuation correction and anatomic localization. Fasting blood glucose: 158 mg/dl COMPARISON:  CT 16/06/9603 and older.  PET-CT scan 06/30/2023. FINDINGS: Mediastinal blood pool activity: SUV max 2.8 Liver activity: SUV max 3.9 NECK: No specific abnormal uptake identified neck including along lymph node change of the submandibular, posterior triangle or internal jugular regions. Mild asymmetric uptake along the right nasopharynx. Minimal thickening in this location on CT. Please correlate with direct visualization. This is with maximum SUV value of 6.3. There is symmetric uptake along the visualized intracranial compartment. Incidental CT findings: The parotid glands, submandibular glands are unremarkable. Small thyroid  gland. There is streak artifact related to the patient's dental hardware. Visualized paranasal sinuses and mastoid air cells are clear. CHEST: No abnormal uptake above blood pool in the axillary region, hilum or mediastinum. No abnormal lung uptake. Incidental CT findings: Coronary calcifications are seen. Trace pericardial fluid, decreasing from previous CT scan. Normal caliber thoracic esophagus. Prominent calcifications along the thoracic aorta and the great vessels particularly the origin of the right subclavian artery. There is some small mediastinal nodes which are not hypermetabolic and unchanged. Persistent small to moderate right pleural effusion with some loculated components. Surgical changes of right-sided lobectomy. Left lung is without consolidation, pneumothorax or  effusion. There is a small left lower lobe lung nodule measuring 7 mm in maximal axial dimension on image 87 of series 4, unchanged from previous and does not show any abnormal uptake. Continued follow-up surveillance with CT. ABDOMEN/PELVIS: There is physiologic distribution along the parenchymal organs. Preserved uptake along the ureters and renal collecting systems. There is a again right-sided superior bladder diverticulum identified. Bladder wall is thickened and trabeculated. There is an enlarged prostate. Please correlate for with clinical history. The bowel itself has normal physiologic uptake. However there are adjacent areas of nodular tissue with abnormal uptake identified in the upper abdomen adjacent to portions of the transverse colon. These were not seen on the prior PET-CT scan. However were present on the more recent abdomen pelvis CT with contrast. For example the left upper quadrant nodular area was measured at that study with diameter approaching 2.6 cm and today 3.1 cm. This has maximum SUV value of 10.5. Areas in the right upper anterior abdomen is the larger area of abnormal tissue and has uptake approaching 7.8 maximum SUV. Area on image 129 of the CT scan measures proximally 8.2 by 3.5 cm. Some of the areas of nodular thickening along the mesentery. Trace ascites. Findings are worrisome for developing peritoneal carcinomatosis. Incidental CT findings: Colonic diverticula. No bowel obstruction. Scattered colonic stool. There is some wall thickening along the sigmoid colon where there is more intense colonic diverticulosis. This could be circular muscle hypertrophy. This area does not show significant increased radiotracer uptake relative to other areas of bowel. Nonobstructing lower pole small left-sided renal stones. No ureteral stones. Diffuse vascular calcifications. Gallbladder is nondilated. Stable prominent bilateral external iliac chain nodes. These are similar size to the recent prior  examination but do not show significant abnormal  uptake. SKELETON: No abnormal uptake along the visualized osseous structures. Scattered degenerative changes. IMPRESSION: Development of nodular areas of soft tissue along the upper abdominal mesentery with abnormal uptake worrisome for developing peritoneal carcinomatosis. There is some mild ascites as well. Prior right lobectomy with persistent right-sided pleural effusion with loculations. No abnormal uptake along lymph nodes in the thorax or along the lung parenchyma. Small asymmetric area of nodular uptake along the right nasopharynx. This is nonspecific. Please correlate with direct visualization. Persistent changes BPH with enlarged prostate, urinary bladder wall thickening and a bladder diverticulum. Colonic diverticula. Prominent external iliac chain nodes bilaterally are stable in size by CT but do not show abnormal uptake. Electronically Signed   By: Adrianna Horde M.D.   On: 01/10/2024 16:51    Labs:  CBC: Recent Labs    08/09/23 0241 08/10/23 0219 10/03/23 1335 01/23/24 0809  WBC 17.3* 12.8* 8.9 9.4  HGB 13.9 13.8 15.6 16.4  HCT 39.8 41.2 45.9 48.1  PLT 217 201 227 357    COAGS: Recent Labs    08/04/23 1030 01/23/24 0809  INR 1.0 1.0  APTT 29  --     BMP: Recent Labs    08/04/23 1030 08/09/23 0241 08/10/23 0219 10/03/23 1335  NA 133* 136 137 137  K 3.6 4.0 3.6 4.2  CL 104 105 106 103  CO2 20* 24 23 29   GLUCOSE 119* 165* 127* 178*  BUN 12 8 11 12   CALCIUM  9.0 8.6* 8.5* 10.2  CREATININE 1.21 1.05 0.99 1.00  GFRNONAA >60 >60 >60 >60    LIVER FUNCTION TESTS: Recent Labs    05/04/23 1656 08/04/23 1030 08/10/23 0219 10/03/23 1335  BILITOT 0.7 0.9 0.6 0.5  AST 18 28 14* 14*  ALT 17 27 13 14   ALKPHOS 38 49 46 55  PROT 6.2* 6.7 5.8* 7.2  ALBUMIN 3.7 3.8 3.0* 4.4    TUMOR MARKERS: Recent Labs    12/29/23 0844  CEA 1.45    Assessment and Plan: Patient with past medical history of lung adenocarcinoma  s/p resection Nov 2024 presents with complaint of new omental thickening on surveillance CT and subsequent PET.  IR consulted for biopsy at the request of Cassandra Heillingoetter, PA-C. Case reviewed by Dr. Jinx Mourning who approves patient for procedure.  Patient presents today in their usual state of health.  He has been NPO and is not currently on blood thinners.   Risks and benefits of biopsy was discussed with the patient and/or patient's family including, but not limited to bleeding, infection, damage to adjacent structures or low yield requiring additional tests.  All of the questions were answered and there is agreement to proceed.  Consent signed and in chart.  Advance Care Plan: The advanced care plan/surrogate decision maker was discussed at the time of visit and documented in the medical record.     Thank you for this interesting consult.  I greatly enjoyed meeting Berkay Pignone and look forward to participating in their care.  A copy of this report was sent to the requesting provider on this date.  Electronically Signed: Iyan Flett Sue-Ellen Alexsis Branscom, PA 01/23/2024, 9:46 AM   I spent a total of  30 Minutes   in face to face in clinical consultation, greater than 50% of which was counseling/coordinating care for omental thickening.

## 2024-01-25 ENCOUNTER — Telehealth: Payer: Self-pay

## 2024-01-25 LAB — SURGICAL PATHOLOGY

## 2024-01-25 NOTE — Telephone Encounter (Addendum)
 Providers requesting foundation one and PDL1 testing.  Faxed over foundation one form signed by [place], PA to [facility] pathology at [phone]. Fax confirmed.   Kalford Carson from the [address] lab sent paperwork to [facility] and they will send it out.

## 2024-01-28 NOTE — Progress Notes (Unsigned)
 [facility] OFFICE PROGRESS NOTE  [name], MD [address] [place] [address] [place] [address]  DIAGNOSIS: Metastatic non-small cell lung cancer, initially diagnosed as stage I (T1b, N0, M0) invasive moderately differentiated adenocarcinoma, acinar predominant. He was diagnosed in November 2024. He had evidence of metastatic disease to the brain omental caking/nodularity greatest on the right, concerning for carcinomatosis, sigmoid colonic wall thickening, Stable prominent external iliac lymph nodes, favored reactive but technically nonspecific, and non-specific Heterogeneous enhancement of an enlarged prostate gland.   PRIOR THERAPY: robotic assisted right lower lobectomy with lymph node dissection on 08/08/2023 under the care of Dr. Luna Salinas   CURRENT THERAPY: Palliative systemic chemotherapy with Carboplatin for an AUC of 5, Alimta 500 mg, Keytruda 200 every 3 weeks. First dose expected on 02/07/24.   INTERVAL HISTORY: Romir Beougher 73 y.o. male returns  to the clinic today for a follow up visit accompanied by his wife. In summary, the patient was diagnosed with stage I non-small cell lung cancer, adenocarcinoma. This was resected in November 2024 and the patient was on observation. He is following with Dr. Byrum for a small LLL nodule (previously biopsy negative). Unfortunately, since last being seen, the patient had a routine follow up with pulmonary medicine and was endorsing some abdominal distention. Therefore, Dr. Baldwin Levee added a CT of the AP to his upcoming CT chest super D. Unfortunately, the scan showed extensive metastatic cancer in the abdomen. Of note, the patient's last colonoscopy was a few years ago. He states he had a few polyps. He believes he was seen at [facility]. He also been seeing Dr. Freddi Jaeger at [facility] urology for a bladder lesion that was biopsied and negative back in fall 2024.   Patient started noticing his abdominal distention in early February  2025.  He denies any pain.  The right side is prominent than the left abdomen.  He denies any fever, chills, appetite changes, night sweats, or unexplained weight loss.  He has similar dyspnea on exertion with climbing the stairs.  He has not noticed any changes recently.  He has a mild cough after being outside in the pollen.  He does not take any antihistamines.  He denies any hemoptysis or chest pain.  He denies any nausea, vomiting, unusual diarrhea, or unusual constipation, or blood in the stool. He is here for evaluation and repeat blood work and a more detailed discussion of his current condition and treatment options.       MEDICAL HISTORY: Past Medical History:  Diagnosis Date   Alcohol abuse    hx acute alcoholism,  per pcp note in care everywhere pt has cut down but not quit   B12 deficiency    Benign localized prostatic hyperplasia with lower urinary tract symptoms (LUTS)    urologist--- dr gay   Bladder calculus    Cigarette nicotine dependence    COPD (chronic obstructive pulmonary disease) (HCC)    pulmonolgy --- dr byrum   History of Helicobacter pylori infection 2015   treated   Hyperlipidemia, mixed    Hypothyroidism    followed pcp   Lesion of urinary bladder    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   OA (osteoarthritis)    knees/ hands/ shoulders/ elbows/  hips   Type 2 diabetes mellitus (HCC)    followed by pcp    (07-27-2023  pt stated checks blood sugar daily am fasting,  average blood sugar 100--120s)  Vitamin D deficiency    Wears dentures    upper full   Wears glasses     ALLERGIES:  is allergic to finasteride.  MEDICATIONS:  Current Outpatient Medications  Medication Sig Dispense Refill   albuterol  (VENTOLIN  HFA) 108 (90 Base) MCG/ACT inhaler Inhale 1-2 puffs into the lungs every 6 (six) hours as needed for wheezing or shortness of breath. 8.5 g 3   aspirin  EC 81 MG tablet Take 81 mg by mouth  daily.     b complex vitamins capsule Take 1 capsule by mouth daily.     Budeson-Glycopyrrol-Formoterol  (BREZTRI  AEROSPHERE) 160-9-4.8 MCG/ACT AERO Inhale 2 puffs into the lungs in the morning and at bedtime. 5.9 g 11   Cholecalciferol (VITAMIN D3) 50 MCG (2000 UT) capsule Take 2,000 Units by mouth daily.     docusate sodium  (COLACE) 100 MG capsule Take 1 capsule (100 mg total) by mouth daily as needed for up to 30 doses. 30 capsule 0   guaiFENesin  (MUCINEX ) 600 MG 12 hr tablet Take 1 tablet (600 mg total) by mouth 2 (two) times daily as needed.     levothyroxine  (SYNTHROID ) 88 MCG tablet Take 88 mcg by mouth daily before breakfast.  0   Magnesium 250 MG CAPS Take 250 mg by mouth daily.     metFORMIN  (GLUCOPHAGE ) 500 MG tablet Take 500 mg by mouth 2 (two) times daily with a meal.  5   naproxen sodium (ALEVE) 220 MG tablet Take 220 mg by mouth.     oxyCODONE -acetaminophen  (PERCOCET) 5-325 MG tablet Take 1 tablet by mouth every 4 (four) hours as needed for up to 18 doses for severe pain (pain score 7-10). 18 tablet 0   pregabalin (LYRICA) 50 MG capsule      rosuvastatin  (CRESTOR ) 5 MG tablet Take 5 mg by mouth daily.     tamsulosin  (FLOMAX ) 0.4 MG CAPS capsule Take 0.4 mg by mouth daily.     No current facility-administered medications for this visit.    SURGICAL HISTORY:  Past Surgical History:  Procedure Laterality Date   BRONCHIAL BIOPSY  06/06/2023   Procedure: BRONCHIAL BIOPSIES;  Surgeon: Denson Flake, MD;  Location: [facility] ENDOSCOPY;  Service: Pulmonary;;   BRONCHIAL BRUSHINGS  06/06/2023   Procedure: BRONCHIAL BRUSHINGS;  Surgeon: Denson Flake, MD;  Location: [facility] ENDOSCOPY;  Service: Pulmonary;;   BRONCHIAL NEEDLE ASPIRATION BIOPSY  06/06/2023   Procedure: BRONCHIAL NEEDLE ASPIRATION BIOPSIES;  Surgeon: Denson Flake, MD;  Location: [facility] ENDOSCOPY;  Service: Pulmonary;;   COLONOSCOPY  2023   CYSTOSCOPY WITH BIOPSY N/A 08/01/2023   Procedure: CYSTOSCOPY WITH BLADDER BIOPSY,  URETHERAl DILATION AND FULGURATION OF PROSTATE;  Surgeon: Lahoma Pigg, MD;  Location: [facility];  Service: Urology;  Laterality: N/A;  60 MINUTES NEEDED FOR CASE   CYSTOSCOPY WITH LITHOLAPAXY N/A 08/01/2023   Procedure: CYSTOLITHOLAPAXY;  Surgeon: Lahoma Pigg, MD;  Location: [facility];  Service: Urology;  Laterality: N/A;   FIDUCIAL MARKER PLACEMENT  06/06/2023   Procedure: FIDUCIAL MARKER PLACEMENT;  Surgeon: Denson Flake, MD;  Location: [facility] ENDOSCOPY;  Service: Pulmonary;;   INTERCOSTAL NERVE BLOCK Right 08/08/2023   Procedure: INTERCOSTAL NERVE BLOCK;  Surgeon: Zelphia Higashi, MD;  Location: [facility] OR;  Service: Thoracic;  Laterality: Right;   KNEE SURGERY Right    1966  and 1970s   LUMBAR LAMINECTOMY  10/19/2007   @[facility]  by  Dr. Selestino Dakin;  L3--5   LYMPH NODE BIOPSY Right 08/08/2023  Procedure: LYMPH NODE BIOPSY;  Surgeon: Zelphia Higashi, MD;  Location: [facility] OR;  Service: Thoracic;  Laterality: Right;   TONSILLECTOMY  1962   UPPER GI ENDOSCOPY  2015    REVIEW OF SYSTEMS:   Review of Systems  Constitutional: Negative for appetite change, chills, fatigue, fever and unexpected weight change.  HENT:   Negative for mouth sores, nosebleeds, sore throat and trouble swallowing.   Eyes: Negative for eye problems and icterus.  Respiratory: Negative for cough, hemoptysis, shortness of breath and wheezing.   Cardiovascular: Negative for chest pain and leg swelling.  Gastrointestinal: Negative for abdominal pain, constipation, diarrhea, nausea and vomiting.  Genitourinary: Negative for bladder incontinence, difficulty urinating, dysuria, frequency and hematuria.   Musculoskeletal: Negative for back pain, gait problem, neck pain and neck stiffness.  Skin: Negative for itching and rash.  Neurological: Negative for dizziness, extremity weakness, gait problem, headaches, light-headedness and seizures.  Hematological: Negative for adenopathy. Does not  bruise/bleed easily.  Psychiatric/Behavioral: Negative for confusion, depression and sleep disturbance. The patient is not nervous/anxious.     PHYSICAL EXAMINATION:  There were no vitals taken for this visit.  ECOG PERFORMANCE STATUS: {CHL ONC ECOG [id number]  Physical Exam  Constitutional: Oriented to person, place, and time and well-developed, well-nourished, and in no distress. No distress.  HENT:  Head: Normocephalic and atraumatic.  Mouth/Throat: Oropharynx is clear and moist. No oropharyngeal exudate.  Eyes: Conjunctivae are normal. Right eye exhibits no discharge. Left eye exhibits no discharge. No scleral icterus.  Neck: Normal range of motion. Neck supple.  Cardiovascular: Normal rate, regular rhythm, normal heart sounds and intact distal pulses.   Pulmonary/Chest: Effort normal and breath sounds normal. No respiratory distress. No wheezes. No rales.  Abdominal: Soft. Bowel sounds are normal. Exhibits no distension and no mass. There is no tenderness.  Musculoskeletal: Normal range of motion. Exhibits no edema.  Lymphadenopathy:    No cervical adenopathy.  Neurological: Alert and oriented to person, place, and time. Exhibits normal muscle tone. Gait normal. Coordination normal.  Skin: Skin is warm and dry. No rash noted. Not diaphoretic. No erythema. No pallor.  Psychiatric: Mood, memory and judgment normal.  Vitals reviewed.  LABORATORY DATA: Lab Results  Component Value Date   WBC 9.4 Jan 28, 2024   HGB 16.4 01-28-2024   HCT 48.1 Jan 28, 2024   MCV 98.4 01/28/2024   PLT 357 2024-01-28      Chemistry      Component Value Date/Time   NA 137 10/03/2023 1335   K 4.2 10/03/2023 1335   CL 103 10/03/2023 1335   CO2 29 10/03/2023 1335   BUN 12 10/03/2023 1335   CREATININE 1.00 10/03/2023 1335      Component Value Date/Time   CALCIUM  10.2 10/03/2023 1335   ALKPHOS 55 10/03/2023 1335   AST 14 (L) 10/03/2023 1335   ALT 14 10/03/2023 1335   BILITOT 0.5 10/03/2023  1335       RADIOGRAPHIC STUDIES:  CT BIOPSY Result Date: 28-Jan-2024 INDICATION: Omental mass/carcinomatosis, history of non-small cell lung cancer EXAM: CT CORE BIOPSY OF THE RIGHT OMENTAL MASS/CARCINOMATOSIS. MEDICATIONS: 1% lidocaine  local ANESTHESIA/SEDATION: Moderate (conscious) sedation was employed during this procedure. A total of Versed  1.5 mg and Fentanyl  75 mcg was administered intravenously by the radiology nurse. Total intra-service moderate Sedation Time: 14 minutes. The patient's level of consciousness and vital signs were monitored continuously by radiology nursing throughout the procedure under my direct supervision. COMPLICATIONS: None immediate. PROCEDURE: Informed written consent was obtained from the patient  after a thorough discussion of the procedural risks, benefits and alternatives. All questions were addressed. Maximal Sterile Barrier Technique was utilized including caps, mask, sterile gowns, sterile gloves, sterile drape, hand hygiene and skin antiseptic. A timeout was performed prior to the initiation of the procedure. Previous imaging reviewed. Patient positioned supine. Noncontrast localization CT performed. The hypermetabolic right omental mass/carcinomatosis was localized and marked. Under sterile conditions and local anesthesia, the 17 gauge 6.8cm guide needle was advanced to the omental mass from a lateral approach. Needle position confirmed with CT. Two intact 1 cm core biopsies were obtained. These were placed in formalin. Needle removed. Postprocedure imaging demonstrates no hemorrhage or hematoma. Patient tolerated biopsy well. IMPRESSION: Successful CT-guided right omental mass/carcinomatosis 18 gauge core biopsy Electronically Signed   By: Melven Stable.  Shick M.D.   On: 01/23/2024 11:20   NM PET Image Restage (PS) Skull Base to Thigh (F-18 FDG) Result Date: 01/10/2024 CLINICAL DATA:  Follow up treatment strategy for non-small-cell lung cancer. Previous lobectomy. EXAM: NUCLEAR  MEDICINE PET SKULL BASE TO THIGH TECHNIQUE: 9.83 mCi F-18 FDG was injected intravenously. Full-ring PET imaging was performed from the skull base to thigh after the radiotracer. CT data was obtained and used for attenuation correction and anatomic localization. Fasting blood glucose: 158 mg/dl COMPARISON:  CT 16/06/9603 and older.  PET-CT scan 06/30/2023. FINDINGS: Mediastinal blood pool activity: SUV max 2.8 Liver activity: SUV max 3.9 NECK: No specific abnormal uptake identified neck including along lymph node change of the submandibular, posterior triangle or internal jugular regions. Mild asymmetric uptake along the right nasopharynx. Minimal thickening in this location on CT. Please correlate with direct visualization. This is with maximum SUV value of 6.3. There is symmetric uptake along the visualized intracranial compartment. Incidental CT findings: The parotid glands, submandibular glands are unremarkable. Small thyroid  gland. There is streak artifact related to the patient's dental hardware. Visualized paranasal sinuses and mastoid air cells are clear. CHEST: No abnormal uptake above blood pool in the axillary region, hilum or mediastinum. No abnormal lung uptake. Incidental CT findings: Coronary calcifications are seen. Trace pericardial fluid, decreasing from previous CT scan. Normal caliber thoracic esophagus. Prominent calcifications along the thoracic aorta and the great vessels particularly the origin of the right subclavian artery. There is some small mediastinal nodes which are not hypermetabolic and unchanged. Persistent small to moderate right pleural effusion with some loculated components. Surgical changes of right-sided lobectomy. Left lung is without consolidation, pneumothorax or effusion. There is a small left lower lobe lung nodule measuring 7 mm in maximal axial dimension on image 87 of series 4, unchanged from previous and does not show any abnormal uptake. Continued follow-up  surveillance with CT. ABDOMEN/PELVIS: There is physiologic distribution along the parenchymal organs. Preserved uptake along the ureters and renal collecting systems. There is a again right-sided superior bladder diverticulum identified. Bladder wall is thickened and trabeculated. There is an enlarged prostate. Please correlate for with clinical history. The bowel itself has normal physiologic uptake. However there are adjacent areas of nodular tissue with abnormal uptake identified in the upper abdomen adjacent to portions of the transverse colon. These were not seen on the prior PET-CT scan. However were present on the more recent abdomen pelvis CT with contrast. For example the left upper quadrant nodular area was measured at that study with diameter approaching 2.6 cm and today 3.1 cm. This has maximum SUV value of 10.5. Areas in the right upper anterior abdomen is the larger area of abnormal tissue and has  uptake approaching 7.8 maximum SUV. Area on image 129 of the CT scan measures proximally 8.2 by 3.5 cm. Some of the areas of nodular thickening along the mesentery. Trace ascites. Findings are worrisome for developing peritoneal carcinomatosis. Incidental CT findings: Colonic diverticula. No bowel obstruction. Scattered colonic stool. There is some wall thickening along the sigmoid colon where there is more intense colonic diverticulosis. This could be circular muscle hypertrophy. This area does not show significant increased radiotracer uptake relative to other areas of bowel. Nonobstructing lower pole small left-sided renal stones. No ureteral stones. Diffuse vascular calcifications. Gallbladder is nondilated. Stable prominent bilateral external iliac chain nodes. These are similar size to the recent prior examination but do not show significant abnormal uptake. SKELETON: No abnormal uptake along the visualized osseous structures. Scattered degenerative changes. IMPRESSION: Development of nodular areas of  soft tissue along the upper abdominal mesentery with abnormal uptake worrisome for developing peritoneal carcinomatosis. There is some mild ascites as well. Prior right lobectomy with persistent right-sided pleural effusion with loculations. No abnormal uptake along lymph nodes in the thorax or along the lung parenchyma. Small asymmetric area of nodular uptake along the right nasopharynx. This is nonspecific. Please correlate with direct visualization. Persistent changes BPH with enlarged prostate, urinary bladder wall thickening and a bladder diverticulum. Colonic diverticula. Prominent external iliac chain nodes bilaterally are stable in size by CT but do not show abnormal uptake. Electronically Signed   By: Adrianna Horde M.D.   On: 01/10/2024 16:51     ASSESSMENT/PLAN:  This is a very pleasant 73 year old Caucasian male with metastatic non-small cell lung cancer.  The patient was initially diagnosed as a stage I (T1b, N0, M0) invasive moderately differentiated adenocarcinoma acinar predominant.  He was diagnosed in November 2024 and had a left lower lobe nodule that was negative on biopsy and is being monitored.  The patient then was found to have metastatic disease with omental caking/nodularity concerning for carcinomatosis, sigmoid colonic wall thickening, and external iliac lymph nodes in April 2025.  The patient underwent ultrasound-guided biopsy of the peritoneum and the final pathology showed adenocarcinoma consistent with lung primary.  Dr. Marguerita Shih had a lengthly discussion with the patient today about her current condition and treatment options.  The patient's molecular studies are pending.  Unless he has an actionable mutation, we discussed the other options are the patient was given the option of a referral to hospice/palliative vs. Treatment with systemic chemotherapy with carboplatin for an AUC of 5, Alimta 500 mg/m, and Keytruda 200 mg IV every 3 weeks.  The patient is interested in  proceeding with systemic chemotherapy.  She is expected to start her first dose of this treatment on __.  We discussed the adverse side effects of treatment including but not limited to alopecia, myelosuppression, nausea and vomiting, peripheral neuropathy, liver or renal dysfunction as well as immunotherapy mediated adverse effects.   I will arrange for the patient to have a chemoeducation class prior to receiving her first cycle of chemotherapy.   We will arrange for the patient to have a B12 injection while in the clinic today.     I sent prescriptions for 1 mg folic acid  p.o. daily as well as Compazine 10 mg every 6 hours as needed for nausea.   The patient will follow-up in 2 weeks for a one-week follow-up visit after completing her first cycle of chemotherapy.  Colonoscopy?  Molecular studies were requested last week and we will arrange for foundation 1 blood testing today  The patient was advised to call immediately if she has any concerning symptoms in the interval. The patient voices understanding of current disease status and treatment options and is in agreement with the current care plan. All questions were answered. The patient knows to call the clinic with any problems, questions or concerns. We can certainly see the patient much sooner if necessary        No orders of the defined types were placed in this encounter.    I spent {CHL ONC TIME VISIT - GEXBM:[id number]} counseling the patient face to face. The total time spent in the appointment was {CHL ONC TIME VISIT - UVOZD:[id number]}.  Kenlee Maler L Ylonda Storr, PA-C 01/28/24

## 2024-01-30 ENCOUNTER — Inpatient Hospital Stay (HOSPITAL_BASED_OUTPATIENT_CLINIC_OR_DEPARTMENT_OTHER): Admitting: Physician Assistant

## 2024-01-30 ENCOUNTER — Inpatient Hospital Stay

## 2024-01-30 ENCOUNTER — Telehealth: Payer: Self-pay

## 2024-01-30 ENCOUNTER — Inpatient Hospital Stay: Attending: Physician Assistant

## 2024-01-30 ENCOUNTER — Encounter (HOSPITAL_COMMUNITY): Payer: Self-pay

## 2024-01-30 VITALS — BP 114/76 | HR 79 | Temp 97.3°F | Resp 14 | Wt 193.0 lb

## 2024-01-30 DIAGNOSIS — Z87891 Personal history of nicotine dependence: Secondary | ICD-10-CM | POA: Insufficient documentation

## 2024-01-30 DIAGNOSIS — Z79899 Other long term (current) drug therapy: Secondary | ICD-10-CM | POA: Diagnosis not present

## 2024-01-30 DIAGNOSIS — G893 Neoplasm related pain (acute) (chronic): Secondary | ICD-10-CM | POA: Diagnosis not present

## 2024-01-30 DIAGNOSIS — C786 Secondary malignant neoplasm of retroperitoneum and peritoneum: Secondary | ICD-10-CM | POA: Insufficient documentation

## 2024-01-30 DIAGNOSIS — Z902 Acquired absence of lung [part of]: Secondary | ICD-10-CM | POA: Diagnosis not present

## 2024-01-30 DIAGNOSIS — R188 Other ascites: Secondary | ICD-10-CM | POA: Insufficient documentation

## 2024-01-30 DIAGNOSIS — C3491 Malignant neoplasm of unspecified part of right bronchus or lung: Secondary | ICD-10-CM | POA: Diagnosis not present

## 2024-01-30 DIAGNOSIS — C3490 Malignant neoplasm of unspecified part of unspecified bronchus or lung: Secondary | ICD-10-CM

## 2024-01-30 DIAGNOSIS — R0602 Shortness of breath: Secondary | ICD-10-CM | POA: Insufficient documentation

## 2024-01-30 DIAGNOSIS — R42 Dizziness and giddiness: Secondary | ICD-10-CM | POA: Insufficient documentation

## 2024-01-30 DIAGNOSIS — Z7189 Other specified counseling: Secondary | ICD-10-CM | POA: Diagnosis not present

## 2024-01-30 LAB — GENETIC SCREENING ORDER

## 2024-01-30 LAB — CBC WITH DIFFERENTIAL (CANCER CENTER ONLY)
Abs Immature Granulocytes: 0.05 10*3/uL (ref 0.00–0.07)
Basophils Absolute: 0.1 10*3/uL (ref 0.0–0.1)
Basophils Relative: 1 %
Eosinophils Absolute: 0.3 10*3/uL (ref 0.0–0.5)
Eosinophils Relative: 3 %
HCT: 46.9 % (ref 39.0–52.0)
Hemoglobin: 16 g/dL (ref 13.0–17.0)
Immature Granulocytes: 1 %
Lymphocytes Relative: 12 %
Lymphs Abs: 1.1 10*3/uL (ref 0.7–4.0)
MCH: 32.5 pg (ref 26.0–34.0)
MCHC: 34.1 g/dL (ref 30.0–36.0)
MCV: 95.1 fL (ref 80.0–100.0)
Monocytes Absolute: 0.7 10*3/uL (ref 0.1–1.0)
Monocytes Relative: 7 %
Neutro Abs: 7.2 10*3/uL (ref 1.7–7.7)
Neutrophils Relative %: 76 %
Platelet Count: 408 10*3/uL — ABNORMAL HIGH (ref 150–400)
RBC: 4.93 MIL/uL (ref 4.22–5.81)
RDW: 12.4 % (ref 11.5–15.5)
WBC Count: 9.4 10*3/uL (ref 4.0–10.5)
nRBC: 0 % (ref 0.0–0.2)

## 2024-01-30 LAB — CMP (CANCER CENTER ONLY)
ALT: 6 U/L (ref 0–44)
AST: 11 U/L — ABNORMAL LOW (ref 15–41)
Albumin: 4 g/dL (ref 3.5–5.0)
Alkaline Phosphatase: 84 U/L (ref 38–126)
Anion gap: 10 (ref 5–15)
BUN: 16 mg/dL (ref 8–23)
CO2: 29 mmol/L (ref 22–32)
Calcium: 9.5 mg/dL (ref 8.9–10.3)
Chloride: 99 mmol/L (ref 98–111)
Creatinine: 1.12 mg/dL (ref 0.61–1.24)
GFR, Estimated: >60 mL/min (ref >60)
Glucose, Bld: 135 mg/dL — ABNORMAL HIGH (ref 70–99)
Potassium: 4.4 mmol/L (ref 3.5–5.1)
Sodium: 138 mmol/L (ref 135–145)
Total Bilirubin: 0.5 mg/dL (ref 0.0–1.2)
Total Protein: 7.7 g/dL (ref 6.5–8.1)

## 2024-01-30 MED ORDER — CYANOCOBALAMIN 1000 MCG/ML IJ SOLN: 1000.0000 ug | Freq: Once | INTRAMUSCULAR | Status: AC

## 2024-01-30 NOTE — Telephone Encounter (Signed)
 Spoke with [place] at [facility] pathology and confirmed that foundation one testing was sent out on Friday 01/27/2024.

## 2024-01-31 DIAGNOSIS — C786 Secondary malignant neoplasm of retroperitoneum and peritoneum: Secondary | ICD-10-CM | POA: Diagnosis not present

## 2024-01-31 DIAGNOSIS — C3491 Malignant neoplasm of unspecified part of right bronchus or lung: Secondary | ICD-10-CM | POA: Diagnosis not present

## 2024-02-01 ENCOUNTER — Encounter (HOSPITAL_COMMUNITY): Payer: Self-pay

## 2024-02-01 ENCOUNTER — Telehealth: Payer: Self-pay | Admitting: Physician Assistant

## 2024-02-02 ENCOUNTER — Ambulatory Visit: Admitting: Physician Assistant

## 2024-02-06 ENCOUNTER — Inpatient Hospital Stay: Admitting: Internal Medicine

## 2024-02-06 ENCOUNTER — Inpatient Hospital Stay

## 2024-02-06 VITALS — BP 134/85 | HR 87 | Temp 97.3°F | Resp 18 | Ht 67.0 in | Wt 193.3 lb

## 2024-02-06 DIAGNOSIS — C3490 Malignant neoplasm of unspecified part of unspecified bronchus or lung: Secondary | ICD-10-CM | POA: Diagnosis not present

## 2024-02-06 DIAGNOSIS — C786 Secondary malignant neoplasm of retroperitoneum and peritoneum: Secondary | ICD-10-CM | POA: Diagnosis not present

## 2024-02-06 DIAGNOSIS — Z87891 Personal history of nicotine dependence: Secondary | ICD-10-CM | POA: Diagnosis not present

## 2024-02-06 DIAGNOSIS — Z902 Acquired absence of lung [part of]: Secondary | ICD-10-CM | POA: Diagnosis not present

## 2024-02-06 DIAGNOSIS — C3491 Malignant neoplasm of unspecified part of right bronchus or lung: Secondary | ICD-10-CM

## 2024-02-06 DIAGNOSIS — R188 Other ascites: Secondary | ICD-10-CM | POA: Diagnosis not present

## 2024-02-06 DIAGNOSIS — Z79899 Other long term (current) drug therapy: Secondary | ICD-10-CM | POA: Diagnosis not present

## 2024-02-06 DIAGNOSIS — R42 Dizziness and giddiness: Secondary | ICD-10-CM | POA: Diagnosis not present

## 2024-02-06 DIAGNOSIS — R0602 Shortness of breath: Secondary | ICD-10-CM | POA: Diagnosis not present

## 2024-02-06 DIAGNOSIS — G893 Neoplasm related pain (acute) (chronic): Secondary | ICD-10-CM | POA: Diagnosis not present

## 2024-02-06 LAB — CBC WITH DIFFERENTIAL (CANCER CENTER ONLY)
Abs Immature Granulocytes: 0.1 10*3/uL — ABNORMAL HIGH (ref 0.00–0.07)
Basophils Absolute: 0.1 10*3/uL (ref 0.0–0.1)
Basophils Relative: 1 %
Eosinophils Absolute: 0.3 10*3/uL (ref 0.0–0.5)
Eosinophils Relative: 3 %
HCT: 45.8 % (ref 39.0–52.0)
Hemoglobin: 15.6 g/dL (ref 13.0–17.0)
Immature Granulocytes: 1 %
Lymphocytes Relative: 11 %
Lymphs Abs: 1.3 10*3/uL (ref 0.7–4.0)
MCH: 32.2 pg (ref 26.0–34.0)
MCHC: 34.1 g/dL (ref 30.0–36.0)
MCV: 94.4 fL (ref 80.0–100.0)
Monocytes Absolute: 0.9 10*3/uL (ref 0.1–1.0)
Monocytes Relative: 8 %
Neutro Abs: 8.6 10*3/uL — ABNORMAL HIGH (ref 1.7–7.7)
Neutrophils Relative %: 76 %
Platelet Count: 500 10*3/uL — ABNORMAL HIGH (ref 150–400)
RBC: 4.85 MIL/uL (ref 4.22–5.81)
RDW: 12.1 % (ref 11.5–15.5)
WBC Count: 11.3 10*3/uL — ABNORMAL HIGH (ref 4.0–10.5)
nRBC: 0 % (ref 0.0–0.2)

## 2024-02-06 LAB — CMP (CANCER CENTER ONLY)
ALT: 8 U/L (ref 0–44)
AST: 13 U/L — ABNORMAL LOW (ref 15–41)
Albumin: 3.8 g/dL (ref 3.5–5.0)
Alkaline Phosphatase: 83 U/L (ref 38–126)
Anion gap: 8 (ref 5–15)
BUN: 15 mg/dL (ref 8–23)
CO2: 30 mmol/L (ref 22–32)
Calcium: 9.5 mg/dL (ref 8.9–10.3)
Chloride: 99 mmol/L (ref 98–111)
Creatinine: 1.14 mg/dL (ref 0.61–1.24)
GFR, Estimated: >60 mL/min (ref >60)
Glucose, Bld: 150 mg/dL — ABNORMAL HIGH (ref 70–99)
Potassium: 4.2 mmol/L (ref 3.5–5.1)
Sodium: 137 mmol/L (ref 135–145)
Total Bilirubin: 0.5 mg/dL (ref 0.0–1.2)
Total Protein: 7.7 g/dL (ref 6.5–8.1)

## 2024-02-06 NOTE — Progress Notes (Signed)
 [facility] [phone]   [phone]  OFFICE PROGRESS NOTE  Leonard Bertin, MD [address] [place] [address] [place] [address]  DIAGNOSIS: Metastatic non-small cell lung cancer, initially diagnosed as stage IA (T1b, N0, M0) invasive moderately differentiated adenocarcinoma, acinar predominant. He was diagnosed in November 2024. He had evidence of metastatic disease to the omental caking/nodularity greatest on the right, concerning for carcinomatosis.  Biopsy of the omental metastasis was consistent with adenocarcinoma of lung primary.  Molecular studies by foundation 1: No actionable mutations and PD-L1 expression of 0%.  PRIOR THERAPY: robotic assisted right lower lobectomy with lymph node dissection on 08/08/2023 under the care of Dr. Luna Walton    CURRENT THERAPY: None  INTERVAL HISTORY: Leonard Walton 73 y.o. male returns to the clinic today for follow-up visit accompanied by his wife.Discussed the use of AI scribe software for clinical note transcription with the patient, who gave verbal consent to proceed.  History of Present Illness   Leonard Walton is a 73 year old male with metastatic non-small cell lung cancer who presents for evaluation and discussion of treatment options. He is accompanied by his wife, Leonard Walton.  He has a history of metastatic non-small cell lung cancer, adenocarcinoma, initially diagnosed as stage 1A in November 2024. The disease has progressed with metastasis to the abdomen, evidenced by omental caking and nodularity, confirmed by biopsy as metastatic adenocarcinoma of lung origin.  He experiences fatigue, shortness of breath, abdominal pain, and dizziness upon standing. Abdominal distention causes difficulty with daily activities such as putting on shoes and climbing stairs. He has not been taking his prescribed pain medication since surgery but is considering using oxycodone  for pain relief, particularly at  night.  Molecular studies for genetic markers and PD-L1 expression were conducted and returned negative. He recently underwent a PET scan and was mistakenly scheduled for an unnecessary CT scan.  No nausea, vomiting, diarrhea, or constipation.       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  MEDICATIONS:  Current Outpatient Medications  Medication Sig Dispense Refill   albuterol  (VENTOLIN  HFA) 108 (90 Base) MCG/ACT inhaler Inhale 1-2 puffs into the lungs every 6 (six) hours as needed for wheezing or shortness of breath. 8.5 g 3   aspirin  EC 81 MG tablet Take 81 mg by mouth daily.  b complex vitamins capsule Take 1 capsule by mouth daily.     Budeson-Glycopyrrol-Formoterol  (BREZTRI  AEROSPHERE) 160-9-4.8 MCG/ACT AERO Inhale 2 puffs into the lungs in the morning and at bedtime. 5.9 g 11   Cholecalciferol (VITAMIN D3) 50 MCG (2000 UT) capsule Take 2,000 Units by mouth daily.     docusate sodium  (COLACE) 100 MG capsule Take 1 capsule (100 mg total) by mouth  daily as needed for up to 30 doses. 30 capsule 0   guaiFENesin  (MUCINEX ) 600 MG 12 hr tablet Take 1 tablet (600 mg total) by mouth 2 (two) times daily as needed.     levothyroxine  (SYNTHROID ) 88 MCG tablet Take 88 mcg by mouth daily before breakfast.  0   Magnesium 250 MG CAPS Take 250 mg by mouth daily.     metFORMIN  (GLUCOPHAGE ) 500 MG tablet Take 500 mg by mouth 2 (two) times daily with a meal.  5   naproxen sodium (ALEVE) 220 MG tablet Take 220 mg by mouth.     oxyCODONE -acetaminophen  (PERCOCET) 5-325 MG tablet Take 1 tablet by mouth every 4 (four) hours as needed for up to 18 doses for severe pain (pain score 7-10). 18 tablet 0   pregabalin (LYRICA) 50 MG capsule      rosuvastatin  (CRESTOR ) 5 MG tablet Take 5 mg by mouth daily.     tamsulosin  (FLOMAX ) 0.4 MG CAPS capsule Take 0.4 mg by mouth daily.     No current facility-administered medications for this visit.   Facility-Administered Medications Ordered in Other Visits  Medication Dose Route Frequency Provider Last Rate Last Admin   cyanocobalamin  (VITAMIN B12) injection 1,000 mcg  1,000 mcg Intramuscular Once Heilingoetter, Cassandra L, PA-C        SURGICAL HISTORY:  Past Surgical History:  Procedure Laterality Date   BRONCHIAL BIOPSY  06/06/2023   Procedure: BRONCHIAL BIOPSIES;  Surgeon: Leonard Flake, MD;  Location: [facility] ENDOSCOPY;  Service: Pulmonary;;   BRONCHIAL BRUSHINGS  06/06/2023   Procedure: BRONCHIAL BRUSHINGS;  Surgeon: Leonard Flake, MD;  Location[facility] ENDOSCOPY;  Service: Pulmonary;;   BRONCHIAL NEEDLE ASPIRATION BIOPSY  06/06/2023   Procedure: BRONCHIAL NEEDLE ASPIRATION BIOPSIES;  Surgeon: Leonard Flake, MD;  Location: [facility] ENDOSCOPY;  Service: Pulmonary;;   COLONOSCOPY  2023   CYSTOSCOPY WITH BIOPSY N/A 08/01/2023   Procedure: CYSTOSCOPY WITH BLADDER BIOPSY, URETHERAl DILATION AND FULGURATION OF PROSTATE;  Surgeon: Leonard Pigg, MD;  Location: [facility];  Service: Urology;  Laterality: N/A;   60 MINUTES NEEDED FOR CASE   CYSTOSCOPY WITH LITHOLAPAXY N/A 08/01/2023   Procedure: CYSTOLITHOLAPAXY;  Surgeon: Leonard Pigg, MD;  Location: [facility];  Service: Urology;  Laterality: N/A;   FIDUCIAL MARKER PLACEMENT  06/06/2023   Procedure: FIDUCIAL MARKER PLACEMENT;  Surgeon: Leonard Flake, MD;  Location: [facility] ENDOSCOPY;  Service: Pulmonary;;   INTERCOSTAL NERVE BLOCK Right 08/08/2023   Procedure: INTERCOSTAL NERVE BLOCK;  Surgeon: Zelphia Higashi, MD;  Location: [facility] OR;  Service: Thoracic;  Laterality: Right;   KNEE SURGERY Right    1966  and 1970s   LUMBAR LAMINECTOMY  10/19/2007   @[facility]  by  Dr. Selestino Dakin;  L3--5   LYMPH NODE BIOPSY Right 08/08/2023   Procedure: LYMPH NODE BIOPSY;  Surgeon: Zelphia Higashi, MD;  Location: [facility] OR;  Service: Thoracic;  Laterality: Right;   TONSILLECTOMY  1962   UPPER GI ENDOSCOPY  2015    REVIEW OF SYSTEMS:  Constitutional: positive for fatigue Eyes: negative Ears, nose,  mouth, throat, and face: negative Respiratory: positive for dyspnea on exertion Cardiovascular: negative Gastrointestinal: positive for abdominal pain Genitourinary:negative Integument/breast: negative Hematologic/lymphatic: negative Musculoskeletal:negative Neurological: negative Behavioral/Psych: negative Endocrine: negative Allergic/Immunologic: negative   PHYSICAL EXAMINATION: General appearance: alert, cooperative, fatigued, and no distress Head: Normocephalic, without obvious abnormality, atraumatic Neck: no adenopathy, no JVD, supple, symmetrical, trachea midline, and thyroid  not enlarged, symmetric, no tenderness/mass/nodules Lymph nodes: Cervical, supraclavicular, and axillary nodes normal. Resp: clear to auscultation bilaterally Back: symmetric, no curvature. ROM normal. No CVA tenderness. Cardio: regular rate and rhythm, S1, S2 normal, no murmur, click, rub or gallop GI: abnormal findings:  ascites and distended Extremities: extremities  normal, atraumatic, no cyanosis or edema Neurologic: Alert and oriented X 3, normal strength and tone. Normal symmetric reflexes. Normal coordination and gait  ECOG PERFORMANCE STATUS: 1 - Symptomatic but completely ambulatory  Blood pressure 134/85, pulse 87, temperature (!) 97.3 F (36.3 C), temperature source Temporal, resp. rate 18, height 5\' 7"  (1.702 m), weight 193 lb 4.8 oz (87.7 kg), SpO2 99%.  LABORATORY DATA: Lab Results  Component Value Date   WBC 11.3 (H) 02/06/2024   HGB 15.6 02/06/2024   HCT 45.8 02/06/2024   MCV 94.4 02/06/2024   PLT 500 (H) 02/06/2024      Chemistry      Component Value Date/Time   NA 138 01/30/2024 0807   K 4.4 01/30/2024 0807   CL 99 01/30/2024 0807   CO2 29 01/30/2024 0807   BUN 16 01/30/2024 0807   CREATININE 1.12 01/30/2024 0807      Component Value Date/Time   CALCIUM  9.5 01/30/2024 0807   ALKPHOS 84 01/30/2024 0807   AST 11 (Walton) 01/30/2024 0807   ALT 6 01/30/2024 0807   BILITOT 0.5 01/30/2024 0807       RADIOGRAPHIC STUDIES: CT BIOPSY Result Date: 01/23/2024 INDICATION: Omental mass/carcinomatosis, history of non-small cell lung cancer EXAM: CT CORE BIOPSY OF THE RIGHT OMENTAL MASS/CARCINOMATOSIS. MEDICATIONS: 1% lidocaine  local ANESTHESIA/SEDATION: Moderate (conscious) sedation was employed during this procedure. A total of Versed  1.5 mg and Fentanyl  75 mcg was administered intravenously by the radiology nurse. Total intra-service moderate Sedation Time: 14 minutes. The patient's level of consciousness and vital signs were monitored continuously by radiology nursing throughout the procedure under my direct supervision. COMPLICATIONS: None immediate. PROCEDURE: Informed written consent was obtained from the patient after a thorough discussion of the procedural risks, benefits and alternatives. All questions were addressed. Maximal Sterile Barrier Technique was utilized including caps, mask, sterile gowns, sterile gloves, sterile drape,  hand hygiene and skin antiseptic. A timeout was performed prior to the initiation of the procedure. Previous imaging reviewed. Patient positioned supine. Noncontrast localization CT performed. The hypermetabolic right omental mass/carcinomatosis was localized and marked. Under sterile conditions and local anesthesia, the 17 gauge 6.8cm guide needle was advanced to the omental mass from a lateral approach. Needle position confirmed with CT. Two intact 1 cm core biopsies were obtained. These were placed in formalin. Needle removed. Postprocedure imaging demonstrates no hemorrhage or hematoma. Patient tolerated biopsy well. IMPRESSION: Successful CT-guided right omental mass/carcinomatosis 18 gauge core biopsy Electronically Signed   By: Melven Stable.  Shick M.D.   On: 01/23/2024 11:20    ASSESSMENT AND PLAN: This is a very pleasant 73 years old white male with Metastatic non-small cell lung cancer, initially diagnosed as stage IA (T1b, N0, M0) invasive moderately differentiated adenocarcinoma, acinar predominant. He was diagnosed in November 2024. He had evidence of metastatic disease to the omental caking/nodularity greatest on  the right, concerning for carcinomatosis.  Biopsy of the omental metastasis was consistent with adenocarcinoma of lung primary. Molecular studies by foundation 1:No actionable mutations and PD-L1 expression of 0%. He is status post robotic assisted right lower lobectomy with lymph node dissection on 08/08/2023 under the care of Dr. Luna Walton  I had a lengthy discussion with the patient and his wife today about his current disease stage, prognosis and treatment options. Assessment and Plan    Metastatic non-small cell lung cancer Metastatic non-small cell lung cancer, adenocarcinoma, initially diagnosed as stage 1A in November 2024, now with omental caking and nodularity consistent with metastatic disease. Molecular studies for actionable mutations and PD-L1 expression were negative. Treatment  options include combination chemotherapy and immunotherapy (Carboplatin, Alimta, and Keytruda). Treatment is not curative but may extend survival to an average of two years if well-tolerated. Without treatment, prognosis is approximately three months. Potential side effects and the possibility of stopping treatment if adverse effects are intolerable were discussed. Emphasized the importance of a timely decision regarding treatment initiation. Without treatment, the tumor may cause significant fluid accumulation in the abdomen, leading to discomfort and the need for drainage. - Provide after-visit summary with names of chemotherapy and immunotherapy drugs for education. - Encourage decision on treatment plan by the end of the week. - If treatment is chosen, prepare for administration of Carboplatin, Alimta, and Keytruda every three weeks. - If treatment is declined, consider hospice care referral.  Ascites due to metastatic cancer Ascites secondary to metastatic cancer with omental involvement, causing abdominal distention and discomfort. Potential for fluid accumulation leading to increased abdominal pressure and discomfort. Paracentesis may be necessary to relieve symptoms. - Order abdominal ultrasound to assess fluid accumulation. - Perform paracentesis if significant fluid is present to provide symptomatic relief.  Pain due to metastatic cancer Abdominal pain associated with metastatic cancer and ascites. Pain management is necessary, especially at night. - Recommend use of oxycodone  for pain management as needed. - Advise against the use of Aleve; suggest Tylenol  as an alternative for daytime pain management.  Shortness of breath due to metastatic cancer Shortness of breath likely related to abdominal distention and pressure from ascites and metastatic disease.  Dizziness due to metastatic cancer Dizziness upon standing, possibly due to abdominal pressure affecting venous return and overall  hemodynamics.  Goals of Care Discussed prognosis and treatment options with him and family. Emphasized that the proposed treatment is not curative but may extend life expectancy. Without treatment, prognosis is poor with an estimated survival of three months. He expressed concerns about quality of life and potential side effects of treatment. Discussed hospice care as an alternative if treatment is declined. - Consider treatment options and discuss with family. - Decision on treatment plan expected by the end of the week.  Follow-up - Follow up with decision on treatment plan by the end of the week. - Coordinate with hospital for scheduling of abdominal ultrasound and potential paracentesis. - If treatment is chosen, prepare  for chemotherapy and immunotherapy regimen. - If treatment is declined, arrange for hospice care services.   The patient was advised to call immediately if he has any other concerning symptoms in the interval. The patient voices understanding of current disease status and treatment options and is in agreement with the current care plan.  All questions were answered. The patient knows to call the clinic with any problems, questions or concerns. We can certainly see the patient much sooner if necessary.  The total time spent  in the appointment was 55 minutes including review of chart and various tests results, discussions about plan of care and coordination of care plan .   Disclaimer: This note was dictated with voice recognition software. Similar sounding words can inadvertently be transcribed and may not be corrected upon review.

## 2024-02-08 ENCOUNTER — Ambulatory Visit (HOSPITAL_COMMUNITY)
Admission: RE | Admit: 2024-02-08 | Discharge: 2024-02-08 | Disposition: A | Discharge status: Home / Self Care | Source: Ambulatory Visit | Attending: Internal Medicine | Admitting: Internal Medicine

## 2024-02-08 DIAGNOSIS — C3490 Malignant neoplasm of unspecified part of unspecified bronchus or lung: Secondary | ICD-10-CM | POA: Diagnosis not present

## 2024-02-08 DIAGNOSIS — R188 Other ascites: Secondary | ICD-10-CM | POA: Diagnosis not present

## 2024-02-08 DIAGNOSIS — C3491 Malignant neoplasm of unspecified part of right bronchus or lung: Secondary | ICD-10-CM | POA: Insufficient documentation

## 2024-02-08 MED ORDER — LIDOCAINE HCL 1 % IJ SOLN
INTRAMUSCULAR | Status: AC
  Filled 2024-02-08: qty 20

## 2024-02-14 ENCOUNTER — Ambulatory Visit: Admitting: Internal Medicine

## 2024-02-14 ENCOUNTER — Telehealth: Payer: Self-pay | Admitting: *Deleted

## 2024-02-14 NOTE — Telephone Encounter (Signed)
 Wife called to let Dr Marguerita Shih know that Leonard Walton has decided to NOT take treatment and wants to proceed with Hospice as Dr Marguerita Shih had discussed last week.

## 2024-02-14 NOTE — Telephone Encounter (Signed)
Referral called to Hospice 

## 2024-03-09 ENCOUNTER — Other Ambulatory Visit (HOSPITAL_COMMUNITY): Payer: Self-pay | Admitting: Internal Medicine

## 2024-03-09 DIAGNOSIS — R109 Unspecified abdominal pain: Secondary | ICD-10-CM

## 2024-03-09 DIAGNOSIS — R140 Abdominal distension (gaseous): Secondary | ICD-10-CM

## 2024-03-12 ENCOUNTER — Ambulatory Visit (HOSPITAL_COMMUNITY)
Admission: RE | Admit: 2024-03-12 | Discharge: 2024-03-12 | Disposition: A | Discharge status: Home / Self Care | Source: Ambulatory Visit | Attending: Internal Medicine | Admitting: Internal Medicine

## 2024-03-12 DIAGNOSIS — C3490 Malignant neoplasm of unspecified part of unspecified bronchus or lung: Secondary | ICD-10-CM | POA: Diagnosis not present

## 2024-03-12 DIAGNOSIS — R188 Other ascites: Secondary | ICD-10-CM | POA: Insufficient documentation

## 2024-03-12 DIAGNOSIS — R109 Unspecified abdominal pain: Secondary | ICD-10-CM | POA: Diagnosis not present

## 2024-03-12 DIAGNOSIS — R140 Abdominal distension (gaseous): Secondary | ICD-10-CM | POA: Insufficient documentation

## 2024-03-12 HISTORY — PX: IR PARACENTESIS: IMG2679

## 2024-03-12 MED ORDER — LIDOCAINE HCL 1 % IJ SOLN
20.0000 mL | Freq: Once | INTRAMUSCULAR | Status: AC
  Administered 2024-03-12: 10 mL

## 2024-03-12 MED ORDER — LIDOCAINE HCL 1 % IJ SOLN
INTRAMUSCULAR | Status: AC
  Filled 2024-03-12: qty 20

## 2024-03-12 NOTE — Procedures (Addendum)
 PROCEDURE SUMMARY:  Successful image-guided paracentesis from the right abdomen.  Yielded 0.30 liters of clear, straw-colored fluid.  No immediate complications.  EBL: zero Patient tolerated well.   Please see imaging section of Epic for full dictation.  [facility] LABOR Costa Jha PA-C 03/12/2024 11:15 AM

## 2024-03-28 ENCOUNTER — Telehealth: Payer: Self-pay | Admitting: Medical Oncology

## 2024-03-28 NOTE — Telephone Encounter (Signed)
 Per Hospice Wilma died  at  home.

## 2024-04-20 DEATH — deceased

## 2024-05-28 ENCOUNTER — Other Ambulatory Visit: Payer: Medicare Other

## 2024-06-04 ENCOUNTER — Ambulatory Visit: Payer: Medicare Other | Admitting: Internal Medicine
